# Patient Record
Sex: Male | Born: 1960 | Race: White | Hispanic: No | Marital: Married | Smoking: Never smoker
Health system: Southern US, Community
[De-identification: ages and names within clinical notes are randomized; demographics above are authoritative.]

## PROBLEM LIST (undated history)

## (undated) DIAGNOSIS — G4700 Insomnia, unspecified: Secondary | ICD-10-CM

## (undated) DIAGNOSIS — M5126 Other intervertebral disc displacement, lumbar region: Secondary | ICD-10-CM

## (undated) DIAGNOSIS — N3281 Overactive bladder: Secondary | ICD-10-CM

## (undated) DIAGNOSIS — M51369 Other intervertebral disc degeneration, lumbar region without mention of lumbar back pain or lower extremity pain: Secondary | ICD-10-CM

## (undated) DIAGNOSIS — G8929 Other chronic pain: Secondary | ICD-10-CM

## (undated) DIAGNOSIS — M549 Dorsalgia, unspecified: Secondary | ICD-10-CM

## (undated) DIAGNOSIS — N400 Enlarged prostate without lower urinary tract symptoms: Secondary | ICD-10-CM

## (undated) DIAGNOSIS — F419 Anxiety disorder, unspecified: Secondary | ICD-10-CM

## (undated) DIAGNOSIS — M5136 Other intervertebral disc degeneration, lumbar region: Secondary | ICD-10-CM

## (undated) DIAGNOSIS — N529 Male erectile dysfunction, unspecified: Secondary | ICD-10-CM

## (undated) DIAGNOSIS — M1990 Unspecified osteoarthritis, unspecified site: Secondary | ICD-10-CM

## (undated) HISTORY — DX: Male erectile dysfunction, unspecified: N52.9

## (undated) HISTORY — DX: Other intervertebral disc displacement, lumbar region: M51.26

## (undated) HISTORY — DX: Overactive bladder: N32.81

## (undated) HISTORY — PX: CARPAL TUNNEL RELEASE: SHX101

## (undated) HISTORY — DX: Insomnia, unspecified: G47.00

## (undated) HISTORY — DX: Benign prostatic hyperplasia without lower urinary tract symptoms: N40.0

## (undated) HISTORY — DX: Other intervertebral disc degeneration, lumbar region without mention of lumbar back pain or lower extremity pain: M51.369

## (undated) HISTORY — DX: Other intervertebral disc degeneration, lumbar region: M51.36

## (undated) HISTORY — PX: FRACTURE SURGERY: SHX138

---

## 2011-02-06 ENCOUNTER — Other Ambulatory Visit (HOSPITAL_COMMUNITY): Payer: Self-pay | Admitting: Neurosurgery

## 2011-02-06 DIAGNOSIS — M51369 Other intervertebral disc degeneration, lumbar region without mention of lumbar back pain or lower extremity pain: Secondary | ICD-10-CM

## 2011-02-06 DIAGNOSIS — M5136 Other intervertebral disc degeneration, lumbar region: Secondary | ICD-10-CM

## 2011-03-12 ENCOUNTER — Ambulatory Visit (HOSPITAL_COMMUNITY)
Admission: RE | Admit: 2011-03-12 | Discharge: 2011-03-12 | Disposition: A | Discharge status: Home / Self Care | Payer: 59 | Source: Ambulatory Visit | Attending: Neurosurgery | Admitting: Neurosurgery

## 2011-03-12 DIAGNOSIS — M51379 Other intervertebral disc degeneration, lumbosacral region without mention of lumbar back pain or lower extremity pain: Secondary | ICD-10-CM | POA: Insufficient documentation

## 2011-03-12 DIAGNOSIS — M5146 Schmorl's nodes, lumbar region: Secondary | ICD-10-CM | POA: Insufficient documentation

## 2011-03-12 DIAGNOSIS — M5136 Other intervertebral disc degeneration, lumbar region: Secondary | ICD-10-CM

## 2011-03-12 DIAGNOSIS — M5137 Other intervertebral disc degeneration, lumbosacral region: Secondary | ICD-10-CM | POA: Insufficient documentation

## 2011-03-12 DIAGNOSIS — M48061 Spinal stenosis, lumbar region without neurogenic claudication: Secondary | ICD-10-CM | POA: Insufficient documentation

## 2011-03-12 DIAGNOSIS — M549 Dorsalgia, unspecified: Secondary | ICD-10-CM | POA: Insufficient documentation

## 2011-03-12 DIAGNOSIS — M519 Unspecified thoracic, thoracolumbar and lumbosacral intervertebral disc disorder: Secondary | ICD-10-CM | POA: Insufficient documentation

## 2011-03-12 MED ORDER — IOHEXOL 180 MG/ML  SOLN
20.0000 mL | Freq: Once | INTRAMUSCULAR | Status: AC | PRN
  Administered 2011-03-12: 20 mL via INTRATHECAL

## 2015-02-21 ENCOUNTER — Encounter (HOSPITAL_COMMUNITY): Payer: Self-pay | Admitting: Neurology

## 2015-02-21 ENCOUNTER — Emergency Department (HOSPITAL_COMMUNITY)
Admission: EM | Admit: 2015-02-21 | Discharge: 2015-02-21 | Disposition: A | Discharge status: Home / Self Care | Payer: 59 | Attending: Emergency Medicine | Admitting: Emergency Medicine

## 2015-02-21 DIAGNOSIS — G8929 Other chronic pain: Secondary | ICD-10-CM | POA: Diagnosis not present

## 2015-02-21 DIAGNOSIS — M5410 Radiculopathy, site unspecified: Secondary | ICD-10-CM

## 2015-02-21 DIAGNOSIS — R202 Paresthesia of skin: Secondary | ICD-10-CM | POA: Diagnosis not present

## 2015-02-21 DIAGNOSIS — M549 Dorsalgia, unspecified: Secondary | ICD-10-CM | POA: Diagnosis not present

## 2015-02-21 DIAGNOSIS — M542 Cervicalgia: Secondary | ICD-10-CM | POA: Insufficient documentation

## 2015-02-21 HISTORY — DX: Dorsalgia, unspecified: M54.9

## 2015-02-21 HISTORY — DX: Other chronic pain: G89.29

## 2015-02-21 MED ORDER — HYDROCODONE-ACETAMINOPHEN 5-325 MG PO TABS: 1.0000 | ORAL_TABLET | Freq: Four times a day (QID) | ORAL | Status: DC | PRN

## 2015-02-21 MED ORDER — NAPROXEN 500 MG PO TABS
500.0000 mg | ORAL_TABLET | Freq: Two times a day (BID) | ORAL | Status: DC | PRN
Start: 2015-02-21 — End: 2015-12-14
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
## 2015-02-21 NOTE — ED Notes (Signed)
Pt reports for 2 months has had pain when turning neck and numbness to left upper back/arm. Has chronic back pain. MD is supposed to schedule MRI.

## 2015-02-21 NOTE — Discharge Instructions (Signed)
Take naprosyn as directed for inflammation and pain with norco for breakthrough pain. Do not drive or operate machinery with muscle relaxation use.  Use heat to the affected areas. Follow up with primary care physician for recheck of ongoing symptoms at your appointment tomorrow. Return to ER for emergent changing or worsening of symptoms.     Cervical Radiculopathy Cervical radiculopathy means a nerve in the neck is pinched or bruised. This can cause pain or loss of feeling (numbness) that runs from your neck to your arm and fingers. HOME CARE   Put ice on the injured or painful area.  Put ice in a plastic bag.  Place a towel between your skin and the bag.  Leave the ice on for 15-20 minutes, 03-04 times a day, or as told by your doctor.  If ice does not help, you can try using heat. Take a warm shower or bath, or use a hot water bottle as told by your doctor.  You may try a gentle neck and shoulder massage.  Use a flat pillow when you sleep.  Only take medicines as told by your doctor.  Keep all physical therapy visits as told by your doctor.  If you are given a soft collar, wear it as told by your doctor. GET HELP RIGHT AWAY IF:   Your pain gets worse and is not controlled with medicine.  You lose feeling or feel weak in your hand, arm, face, or leg.  You have a fever or stiff neck.  You cannot control when you poop or pee (incontinence).  You have trouble with walking, balance, or speaking. MAKE SURE YOU:   Understand these instructions.  Will watch your condition.  Will get help right away if you are not doing well or get worse. Document Released: 10/10/2011 Document Revised: 01/13/2012 Document Reviewed: 10/10/2011 [facility] Patient Information 2015 [place]. This information is not intended to replace advice given to you by your health care provider. Make sure you discuss any questions you have with your health care provider.  Musculoskeletal  Pain Musculoskeletal pain is muscle and boney aches and pains. These pains can occur in any part of the body. Your caregiver may treat you without knowing the cause of the pain. They may treat you if blood or urine tests, X-rays, and other tests were normal.  CAUSES There is often not a definite cause or reason for these pains. These pains may be caused by a type of germ (virus). The discomfort may also come from overuse. Overuse includes working out too hard when your body is not fit. Boney aches also come from weather changes. Bone is sensitive to atmospheric pressure changes. HOME CARE INSTRUCTIONS   Ask when your test results will be ready. Make sure you get your test results.  Only take over-the-counter or prescription medicines for pain, discomfort, or fever as directed by your caregiver. If you were given medications for your condition, do not drive, operate machinery or power tools, or sign legal documents for 24 hours. Do not drink alcohol. Do not take sleeping pills or other medications that may interfere with treatment.  Continue all activities unless the activities cause more pain. When the pain lessens, slowly resume normal activities. Gradually increase the intensity and duration of the activities or exercise.  During periods of severe pain, bed rest may be helpful. Lay or sit in any position that is comfortable.  Putting ice on the injured area.  Put ice in a bag.  Place a  towel between your skin and the bag.  Leave the ice on for 15 to 20 minutes, 3 to 4 times a day.  Follow up with your caregiver for continued problems and no reason can be found for the pain. If the pain becomes worse or does not go away, it may be necessary to repeat tests or do additional testing. Your caregiver may need to look further for a possible cause. SEEK IMMEDIATE MEDICAL CARE IF:  You have pain that is getting worse and is not relieved by medications.  You develop chest pain that is associated  with shortness or breath, sweating, feeling sick to your stomach (nauseous), or throw up (vomit).  Your pain becomes localized to the abdomen.  You develop any new symptoms that seem different or that concern you. MAKE SURE YOU:   Understand these instructions.  Will watch your condition.  Will get help right away if you are not doing well or get worse. Document Released: 10/21/2005 Document Revised: 01/13/2012 Document Reviewed: 06/25/2013 [facility] Patient Information 2015 [place], [place]. This information is not intended to replace advice given to you by your health care provider. Make sure you discuss any questions you have with your health care provider.  Paresthesia Paresthesia is an abnormal burning or prickling sensation. This sensation is generally felt in the hands, arms, legs, or feet. However, it may occur in any part of the body. It is usually not painful. The feeling may be described as:  Tingling or numbness.  "Pins and needles."  Skin crawling.  Buzzing.  Limbs "falling asleep."  Itching. Most people experience temporary (transient) paresthesia at some time in their lives. CAUSES  Paresthesia may occur when you breathe too quickly (hyperventilation). It can also occur without any apparent cause. Commonly, paresthesia occurs when pressure is placed on a nerve. The feeling quickly goes away once the pressure is removed. For some people, however, paresthesia is a long-lasting (chronic) condition caused by an underlying disorder. The underlying disorder may be:  A traumatic, direct injury to nerves. Examples include a:  Broken (fractured) neck.  Fractured skull.  A disorder affecting the brain and spinal cord (central nervous system). Examples include:  Transverse myelitis.  Encephalitis.  Transient ischemic attack.  Multiple sclerosis.  Stroke.  Tumor or blood vessel problems, such as an arteriovenous malformation pressing against the brain or spinal  cord.  A condition that damages the peripheral nerves (peripheral neuropathy). Peripheral nerves are not part of the brain and spinal cord. These conditions include:  Diabetes.  Peripheral vascular disease.  Nerve entrapment syndromes, such as carpal tunnel syndrome.  Shingles.  Hypothyroidism.  Vitamin B12 deficiencies.  Alcoholism.  Heavy metal poisoning (lead, arsenic).  Rheumatoid arthritis.  Systemic lupus erythematosus. DIAGNOSIS  Your caregiver will attempt to find the underlying cause of your paresthesia. Your caregiver may:  Take your medical history.  Perform a physical exam.  Order various lab tests.  Order imaging tests. TREATMENT  Treatment for paresthesia depends on the underlying cause. HOME CARE INSTRUCTIONS  Avoid drinking alcohol.  You may consider massage or acupuncture to help relieve your symptoms.  Keep all follow-up appointments as directed by your caregiver. SEEK IMMEDIATE MEDICAL CARE IF:   You feel weak.  You have trouble walking or moving.  You have problems with speech or vision.  You feel confused.  You cannot control your bladder or bowel movements.  You feel numbness after an injury.  You faint.  Your burning or prickling feeling gets worse when  walking.  You have pain, cramps, or dizziness.  You develop a rash. MAKE SURE YOU:  Understand these instructions.  Will watch your condition.  Will get help right away if you are not doing well or get worse. Document Released: 10/11/2002 Document Revised: 01/13/2012 Document Reviewed: 07/12/2011 [facility] Patient Information 2015 [place], [place]. This information is not intended to replace advice given to you by your health care provider. Make sure you discuss any questions you have with your health care provider.  Neuropathic Pain We often think that pain has a physical cause. If we get rid of the cause, the pain should go away. Nerves themselves can also cause pain. It  is called neuropathic pain, which means nerve abnormality. It may be difficult for the patients who have it and for the treating caregivers. Pain is usually described as acute (short-lived) or chronic (long-lasting). Acute pain is related to the physical sensations caused by an injury. It can last from a few seconds to many weeks, but it usually goes away when normal healing occurs. Chronic pain lasts beyond the typical healing time. With neuropathic pain, the nerve fibers themselves may be damaged or injured. They then send incorrect signals to other pain centers. The pain you feel is real, but the cause is not easy to find.  CAUSES  Chronic pain can result from diseases, such as diabetes and shingles (an infection related to chickenpox), or from trauma, surgery, or amputation. It can also happen without any known injury or disease. The nerves are sending pain messages, even though there is no identifiable cause for such messages.   Other common causes of neuropathy include diabetes, phantom limb pain, or Regional Pain Syndrome (RPS).  As with all forms of chronic back pain, if neuropathy is not correctly treated, there can be a number of associated problems that lead to a downward cycle for the patient. These include depression, sleeplessness, feelings of fear and anxiety, limited social interaction and inability to do normal daily activities or work.  The most dramatic and mysterious example of neuropathic pain is called "phantom limb syndrome." This occurs when an arm or a leg has been removed because of illness or injury. The brain still gets pain messages from the nerves that originally carried impulses from the missing limb. These nerves now seem to misfire and cause troubling pain.  Neuropathic pain often seems to have no cause. It responds poorly to standard pain treatment. Neuropathic pain can occur after:  Shingles (herpes zoster virus infection).  A lasting burning sensation of the skin,  caused usually by injury to a peripheral nerve.  Peripheral neuropathy which is widespread nerve damage, often caused by diabetes or alcoholism.  Phantom limb pain following an amputation.  Facial nerve problems (trigeminal neuralgia).  Multiple sclerosis.  Reflex sympathetic dystrophy.  Pain which comes with cancer and cancer chemotherapy.  Entrapment neuropathy such as when pressure is put on a nerve such as in carpal tunnel syndrome.  Back, leg, and hip problems (sciatica).  Spine or back surgery.  HIV Infection or AIDS where nerves are infected by viruses. Your caregiver can explain items in the above list which may apply to you. SYMPTOMS  Characteristics of neuropathic pain are:  Severe, sharp, electric shock-like, shooting, lightening-like, knife-like.  Pins and needles sensation.  Deep burning, deep cold, or deep ache.  Persistent numbness, tingling, or weakness.  Pain resulting from light touch or other stimulus that would not usually cause pain.  Increased sensitivity to something that would  normally cause pain, such as a pinprick. Pain may persist for months or years following the healing of damaged tissues. When this happens, pain signals no longer sound an alarm about current injuries or injuries about to happen. Instead, the alarm system itself is not working correctly.  Neuropathic pain may get worse instead of better over time. For some people, it can lead to serious disability. It is important to be aware that severe injury in a limb can occur without a proper, protective pain response.Burns, cuts, and other injuries may go unnoticed. Without proper treatment, these injuries can become infected or lead to further disability. Take any injury seriously, and consult your caregiver for treatment. DIAGNOSIS  When you have a pain with no known cause, your caregiver will probably ask some specific questions:   Do you have any other conditions, such as diabetes,  shingles, multiple sclerosis, or HIV infection?  How would you describe your pain? (Neuropathic pain is often described as shooting, stabbing, burning, or searing.)  Is your pain worse at any time of the day? (Neuropathic pain is usually worse at night.)  Does the pain seem to follow a certain physical pathway?  Does the pain come from an area that has missing or injured nerves? (An example would be phantom limb pain.)  Is the pain triggered by minor things such as rubbing against the sheets at night? These questions often help define the type of pain involved. Once your caregiver knows what is happening, treatment can begin. Anticonvulsant, antidepressant drugs, and various pain relievers seem to work in some cases. If another condition, such as diabetes is involved, better management of that disorder may relieve the neuropathic pain.  TREATMENT  Neuropathic pain is frequently long-lasting and tends not to respond to treatment with narcotic type pain medication. It may respond well to other drugs such as antiseizure and antidepressant medications. Usually, neuropathic problems do not completely go away, but partial improvement is often possible with proper treatment. Your caregivers have large numbers of medications available to treat you. Do not be discouraged if you do not get immediate relief. Sometimes different medications or a combination of medications will be tried before you receive the results you are hoping for. See your caregiver if you have pain that seems to be coming from nowhere and does not go away. Help is available.  SEEK IMMEDIATE MEDICAL CARE IF:   There is a sudden change in the quality of your pain, especially if the change is on only one side of the body.  You notice changes of the skin, such as redness, black or purple discoloration, swelling, or an ulcer.  You cannot move the affected limbs. Document Released: 07/18/2004 Document Revised: 01/13/2012 Document Reviewed:  07/18/2004 [facility] Patient Information 2015 [place], [place]. This information is not intended to replace advice given to you by your health care provider. Make sure you discuss any questions you have with your health care provider.

## 2015-11-12 NOTE — Progress Notes (Signed)
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  culture grew streptococcus, was on rocephin 2 g IV once daily since 11/05/2015. CRP was elevated initially, ESR elevated.  Foley catheter placed for BPH.   initially Orthopedics team consulted, pt has been on medical team.   he was in ICU  Only for IV valium for back spasm.  He has been afebrile, normotensive, alert and oriented.   since yesterday he reports worsening left back pain, left leg pain and  left leg weakness, underwent MRI of the back today  Showed possible extension of the discitis with abscess formation, . Orthopedics team wanted the patient to be transferred to [facility] for  ID input, and orthopedics will be consulted by their medical team to follow the patient here. Please call ID for recommendations when the patient gets here.   Hosie Poisson, MD [phone]

## 2015-11-13 ENCOUNTER — Other Ambulatory Visit: Payer: Self-pay

## 2015-11-13 ENCOUNTER — Encounter (HOSPITAL_COMMUNITY): Payer: Self-pay | Admitting: *Deleted

## 2015-11-13 ENCOUNTER — Inpatient Hospital Stay (HOSPITAL_COMMUNITY): Payer: 59

## 2015-11-13 ENCOUNTER — Inpatient Hospital Stay (HOSPITAL_COMMUNITY)
Admission: AD | Admit: 2015-11-13 | Discharge: 2015-11-17 | DRG: 551 | Disposition: A | Discharge status: Home Health | Payer: 59 | Source: Other Acute Inpatient Hospital | Attending: Internal Medicine | Admitting: Internal Medicine

## 2015-11-13 DIAGNOSIS — M519 Unspecified thoracic, thoracolumbar and lumbosacral intervertebral disc disorder: Secondary | ICD-10-CM | POA: Diagnosis not present

## 2015-11-13 DIAGNOSIS — L8990 Pressure ulcer of unspecified site, unspecified stage: Secondary | ICD-10-CM | POA: Insufficient documentation

## 2015-11-13 DIAGNOSIS — M549 Dorsalgia, unspecified: Secondary | ICD-10-CM

## 2015-11-13 DIAGNOSIS — N400 Enlarged prostate without lower urinary tract symptoms: Secondary | ICD-10-CM | POA: Diagnosis not present

## 2015-11-13 DIAGNOSIS — M4806 Spinal stenosis, lumbar region: Secondary | ICD-10-CM | POA: Diagnosis present

## 2015-11-13 DIAGNOSIS — B954 Other streptococcus as the cause of diseases classified elsewhere: Secondary | ICD-10-CM | POA: Diagnosis not present

## 2015-11-13 DIAGNOSIS — M21372 Foot drop, left foot: Secondary | ICD-10-CM | POA: Diagnosis present

## 2015-11-13 DIAGNOSIS — M4626 Osteomyelitis of vertebra, lumbar region: Secondary | ICD-10-CM | POA: Diagnosis present

## 2015-11-13 DIAGNOSIS — N401 Enlarged prostate with lower urinary tract symptoms: Secondary | ICD-10-CM | POA: Diagnosis present

## 2015-11-13 DIAGNOSIS — G061 Intraspinal abscess and granuloma: Secondary | ICD-10-CM | POA: Diagnosis present

## 2015-11-13 DIAGNOSIS — M4646 Discitis, unspecified, lumbar region: Secondary | ICD-10-CM | POA: Diagnosis present

## 2015-11-13 DIAGNOSIS — L89152 Pressure ulcer of sacral region, stage 2: Secondary | ICD-10-CM | POA: Diagnosis present

## 2015-11-13 DIAGNOSIS — R338 Other retention of urine: Secondary | ICD-10-CM | POA: Diagnosis present

## 2015-11-13 DIAGNOSIS — M8610 Other acute osteomyelitis, unspecified site: Secondary | ICD-10-CM | POA: Diagnosis present

## 2015-11-13 DIAGNOSIS — R339 Retention of urine, unspecified: Secondary | ICD-10-CM

## 2015-11-13 DIAGNOSIS — G8929 Other chronic pain: Secondary | ICD-10-CM | POA: Diagnosis present

## 2015-11-13 DIAGNOSIS — Z452 Encounter for adjustment and management of vascular access device: Secondary | ICD-10-CM

## 2015-11-13 HISTORY — DX: Anxiety disorder, unspecified: F41.9

## 2015-11-13 HISTORY — DX: Unspecified osteoarthritis, unspecified site: M19.90

## 2015-11-13 LAB — CBC WITH DIFFERENTIAL/PLATELET
Basophils Absolute: 0 10*3/uL (ref 0.0–0.1)
Basophils Relative: 1 %
EOS ABS: 0.2 10*3/uL (ref 0.0–0.7)
EOS PCT: 3 %
HCT: 42.6 % (ref 39.0–52.0)
HEMOGLOBIN: 14.4 g/dL (ref 13.0–17.0)
LYMPHS ABS: 1.2 10*3/uL (ref 0.7–4.0)
Lymphocytes Relative: 18 %
MCH: 29.8 pg (ref 26.0–34.0)
MCHC: 33.8 g/dL (ref 30.0–36.0)
MCV: 88.2 fL (ref 78.0–100.0)
MONO ABS: 0.9 10*3/uL (ref 0.1–1.0)
MONOS PCT: 13 %
Neutro Abs: 4.6 10*3/uL (ref 1.7–7.7)
Neutrophils Relative %: 65 %
PLATELETS: 369 10*3/uL (ref 150–400)
RBC: 4.83 MIL/uL (ref 4.22–5.81)
RDW: 12.3 % (ref 11.5–15.5)
WBC: 6.9 10*3/uL (ref 4.0–10.5)

## 2015-11-13 LAB — COMPREHENSIVE METABOLIC PANEL
ALBUMIN: 2.9 g/dL — AB (ref 3.5–5.0)
ALT: 24 U/L (ref 17–63)
AST: 22 U/L (ref 15–41)
Alkaline Phosphatase: 78 U/L (ref 38–126)
Anion gap: 11 (ref 5–15)
BUN: 21 mg/dL — AB (ref 6–20)
CHLORIDE: 94 mmol/L — AB (ref 101–111)
CO2: 29 mmol/L (ref 22–32)
Calcium: 9.5 mg/dL (ref 8.9–10.3)
Creatinine, Ser: 1.1 mg/dL (ref 0.61–1.24)
GFR calc Af Amer: >60 mL/min (ref >60)
GFR calc non Af Amer: >60 mL/min (ref >60)
GLUCOSE: 103 mg/dL — AB (ref 65–99)
POTASSIUM: 4.3 mmol/L (ref 3.5–5.1)
Sodium: 134 mmol/L — ABNORMAL LOW (ref 135–145)
Total Bilirubin: 0.7 mg/dL (ref 0.3–1.2)
Total Protein: 6.8 g/dL (ref 6.5–8.1)

## 2015-11-13 LAB — APTT: aPTT: 34 seconds (ref 24–37)

## 2015-11-13 LAB — PROTIME-INR
INR: 1.13 (ref 0.00–1.49)
PROTHROMBIN TIME: 14.7 s (ref 11.6–15.2)

## 2015-11-13 MED ORDER — KETOROLAC TROMETHAMINE 30 MG/ML IJ SOLN
30.0000 mg | Freq: Three times a day (TID) | INTRAMUSCULAR | Status: DC | PRN
Start: 2015-11-13 — End: 2015-11-17
  Administered 2015-11-13: 30 mg via INTRAVENOUS
  Filled 2015-11-13: qty 1

## 2015-11-13 MED ORDER — TAMSULOSIN HCL 0.4 MG PO CAPS
0.8000 mg | ORAL_CAPSULE | Freq: Every day | ORAL | Status: DC
  Administered 2015-11-13 – 2015-11-17 (×5): 0.8 mg via ORAL
  Filled 2015-11-13 (×5): qty 2

## 2015-11-13 MED ORDER — ENOXAPARIN SODIUM 40 MG/0.4ML ~~LOC~~ SOLN
40.0000 mg | SUBCUTANEOUS | Status: DC
  Administered 2015-11-13 – 2015-11-17 (×5): 40 mg via SUBCUTANEOUS
  Filled 2015-11-13 (×5): qty 0.4

## 2015-11-13 MED ORDER — ACETAMINOPHEN 650 MG RE SUPP: 650.0000 mg | Freq: Four times a day (QID) | RECTAL | Status: DC | PRN

## 2015-11-13 MED ORDER — OXYCODONE HCL 5 MG PO TABS
5.0000 mg | ORAL_TABLET | ORAL | Status: DC | PRN
  Administered 2015-11-13 – 2015-11-14 (×2): 5 mg via ORAL
  Filled 2015-11-13 (×2): qty 1

## 2015-11-13 MED ORDER — SODIUM CHLORIDE 0.9 % IJ SOLN
10.0000 mL | INTRAMUSCULAR | Status: DC | PRN
  Administered 2015-11-13 – 2015-11-15 (×5): 10 mL
  Filled 2015-11-13 (×5): qty 40

## 2015-11-13 MED ORDER — DEXTROSE 5 % IV SOLN: 1000.0000 mg | Freq: Three times a day (TID) | INTRAVENOUS | Status: DC | PRN

## 2015-11-13 MED ORDER — ONDANSETRON HCL 4 MG/2ML IJ SOLN: 4.0000 mg | Freq: Four times a day (QID) | INTRAMUSCULAR | Status: DC | PRN

## 2015-11-13 MED ORDER — SODIUM CHLORIDE 0.9 % IV SOLN
250.0000 mL | INTRAVENOUS | Status: DC | PRN
  Administered 2015-11-15: 250 mL via INTRAVENOUS

## 2015-11-13 MED ORDER — MORPHINE SULFATE (PF) 4 MG/ML IV SOLN
4.0000 mg | INTRAVENOUS | Status: DC | PRN
  Administered 2015-11-13 – 2015-11-16 (×9): 4 mg via INTRAVENOUS
  Filled 2015-11-13 (×10): qty 1

## 2015-11-13 MED ORDER — SODIUM CHLORIDE 0.9 % IJ SOLN: 3.0000 mL | INTRAMUSCULAR | Status: DC | PRN

## 2015-11-13 MED ORDER — ONDANSETRON HCL 4 MG PO TABS: 4.0000 mg | ORAL_TABLET | Freq: Four times a day (QID) | ORAL | Status: DC | PRN

## 2015-11-13 MED ORDER — DEXTROSE 5 % IV SOLN
2.0000 g | INTRAVENOUS | Status: DC
  Administered 2015-11-14 – 2015-11-17 (×4): 2 g via INTRAVENOUS
  Filled 2015-11-13 (×7): qty 2

## 2015-11-13 MED ORDER — SODIUM CHLORIDE 0.9 % IJ SOLN
3.0000 mL | Freq: Two times a day (BID) | INTRAMUSCULAR | Status: DC
  Administered 2015-11-13 – 2015-11-17 (×3): 3 mL via INTRAVENOUS

## 2015-11-13 MED ORDER — ZOLPIDEM TARTRATE 5 MG PO TABS
10.0000 mg | ORAL_TABLET | Freq: Every evening | ORAL | Status: DC | PRN
  Administered 2015-11-14 – 2015-11-16 (×4): 10 mg via ORAL
  Filled 2015-11-13 (×4): qty 2

## 2015-11-13 MED ORDER — ACETAMINOPHEN 325 MG PO TABS: 650.0000 mg | ORAL_TABLET | Freq: Four times a day (QID) | ORAL | Status: DC | PRN

## 2015-11-13 NOTE — Progress Notes (Signed)
Arrival Method: via stretcher Mental Status: alert and oriented x4 Telemetry: applied and CCMD notified Skin: stage 2 to sacrum, foam applied Tubes: n/a IV: RUA PICC double lumen Pain: 10/10 back pain (medication given) Family: none present  Safety Measures: bed in lowest position, call bell in reach 6E Orientation: oriented to staff and unit
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
## 2015-11-13 NOTE — Consult Note (Signed)
[facility]    Date of Admission:  11/13/2015   Total days of antibiotics 12               Reason for Consult: Lumbar discitis with epidural abscess    Referring Physician: Dr. Shelly Flattenavid Merrell  Principal Problem:   Lumbar discitis Active Problems:   Abscess in epidural space of lumbar spine   Left foot drop   BPH (benign prostatic hyperplasia)   Urinary retention   Chronic back pain   . cefTRIAXone (ROCEPHIN)  IV  2 g Intravenous Q24H  . enoxaparin (LOVENOX) injection  40 mg Subcutaneous Q24H  . sodium chloride  3 mL Intravenous Q12H  . tamsulosin  0.8 mg Oral Daily    Recommendations: 1. Continue ceftriaxone alone for now   Assessment: Evan Wolf as lumbar discitis with a small epidural abscess caused by Streptococcus mitis, a member of the viridans streptococcal family. His isolate was tested against penicillin and cephalosporins and found to be quite sensitive. I see no indication to broaden his antibiotic therapy at this time. I agree with continued medical management. Vertebral infection is notoriously slow to resolve. I do not expect improvement only 12 days into antibiotic therapy. I will follow with you.    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 He was eventually admitted to [facility] where MRI showed L2-3 discitis. He underwent disc aspiration on 11/02/2015 and cultures grew Streptococcus mitis sensitive to all antibiotics tested. He was initially on vancomycin and ceftriaxone and after culture results returned vancomycin was stopped. He was able to get up and start walking with the physical therapist. He states that he has lost about 25 pounds unintentionally since his illness began and has been extremely weak. He feels like he's had difficulty raising his left foot over the past 4-5 days. Repeat MRI was obtained 2 days ago which showed some slight progression. He was  transferred here today for neurosurgical and infectious disease evaluation.   Review of Systems: Review of Systems  Constitutional: Positive for chills, weight loss, malaise/fatigue and diaphoresis. Negative for fever.  HENT: Negative for sore throat.   Respiratory: Negative for cough and sputum production.   Cardiovascular: Negative for chest pain.  Gastrointestinal: Negative for nausea, vomiting and diarrhea.  Genitourinary: Negative for dysuria.  Musculoskeletal: Positive for back pain.  Skin: Negative for rash.  Neurological: Positive for tingling, focal weakness and weakness. Negative for headaches.    Past Medical History  Diagnosis Date  . Chronic back pain   . Anxiety   . Arthritis     Social History  Substance Use Topics  . Smoking status: Never Smoker   . Smokeless tobacco: None  . Alcohol Use: No    Family History  Problem Relation Age of Onset  . Alzheimer's disease Father   . Parkinson's disease Father   . Diabetes Mellitus II Mother   . Deep vein thrombosis Mother    No Known Allergies  OBJECTIVE: Blood pressure 100/64, pulse 86, temperature 98.4 F (36.9 C), temperature source Oral, resp. rate 18, height 6' (1.829 m), weight 154 lb 1.6 oz (69.9 kg), SpO2 92 %.  Physical Exam  Constitutional: He is oriented to person, place, and time. No distress.  HENT:  Mouth/Throat: No oropharyngeal exudate.  Eyes: Conjunctivae are normal.  Neck: Neck supple.  Cardiovascular: Normal rate  and regular rhythm.   No murmur heard. Pulmonary/Chest: Breath sounds normal.  Abdominal: Soft. He exhibits no mass. There is no tenderness.  Musculoskeletal: Normal range of motion.  Neurological: He is alert and oriented to person, place, and time.  Perhaps a very mild weakness on dorsiflexion of his left foot otherwise no focal abnormalities noted.  Skin: No rash noted.  Psychiatric: Mood and affect normal.    Lab Results Lab Results  Component Value Date   WBC 6.9  11/13/2015   HGB 14.4 11/13/2015   HCT 42.6 11/13/2015   MCV 88.2 11/13/2015   PLT 369 11/13/2015    Lab Results  Component Value Date   CREATININE 1.10 11/13/2015   BUN 21* 11/13/2015   NA 134* 11/13/2015   K 4.3 11/13/2015   CL 94* 11/13/2015   CO2 29 11/13/2015    Lab Results  Component Value Date   ALT 24 11/13/2015   AST 22 11/13/2015   ALKPHOS 78 11/13/2015   BILITOT 0.7 11/13/2015     Microbiology: No results found for this or any previous visit (from the past 240 hour(s)).   Cliffton Asters, MD [facility] for Infectious Disease [facility] [phone] pager   [phone] cell 11/13/2015, 5:23 PM

## 2015-11-13 NOTE — Consult Note (Signed)
Reason for Consult: discitis osteomyelitis epidural abscess Referring Physician:  [name]  Evan Wolf is an 55 y.o. male.  HPI:  55 year old gentleman is admitted back pain ever since December 25 23 Evan Wolf was evaluated and was transferred out ultimately sized primary medical doctor  Regarding looks much 68 back to the ER patient was admitted to Evan Wolf was worked up was diagnosed with a L1-L2 discitis CT aspiration of the disc space grew out a streptococcal organism and he was admitted and placed on Rocephin for he apparently wasn't responding to the IV antibiotics and repeated the MRI showed a worsening of the osteomyelitis and patient was been transferred to Evan Wolf for further workup and management. Currently the patient says   That his his legs have not had as much pain  In them and he hasn't noticed any new numbness or tingling. The pain is all across his low back and hasn't changed.  Past Medical History  Diagnosis Date  . Chronic back pain   . Anxiety   . Arthritis     Past Surgical History  Procedure Laterality Date  . Carpal tunnel release Right   . Fracture surgery Right     ankle    Family History  Problem Relation Age of Onset  . Alzheimer's disease Father   . Parkinson's disease Father   . Diabetes Mellitus II Mother   . Deep vein thrombosis Mother     Social History:  reports that he has never smoked. He does not have any smokeless tobacco history on file. He reports that he does not drink alcohol or use illicit drugs.  Allergies: No Known Allergies  Medications: I have reviewed the patient's current medications.  No results found for this or any previous visit (from the past 48 hour(s)).  Dg Chest Port 1 View  11/13/2015  CLINICAL DATA:  PICC line insertion. EXAM: PORTABLE CHEST 1 VIEW COMPARISON:  11/10/2015. FINDINGS: PICC line noted with tip projected superior vena cava. Mediastinum hilar structures are unremarkable. Cardiomegaly with normal pulmonary  vascularity. Low lung volumes with mild right base subsegmental atelectasis. No pleural effusion or pneumothorax . IMPRESSION: 1. Right PICC line noted with tip projected over the superior vena cava. 2. Low lung volumes with bibasilar subsegmental atelectasis. Electronically Signed   By: Maisie Fushomas  Register   On: 11/13/2015 13:26    Review of Systems  Constitutional: Negative.   HENT: Negative.   Eyes: Negative.   Respiratory: Negative.   Cardiovascular: Negative.   Gastrointestinal: Negative.   Genitourinary: Negative.   Musculoskeletal: Positive for myalgias, back pain and joint pain.  Skin: Negative.   Neurological: Positive for tingling.  Psychiatric/Behavioral: Negative.    Height 6' (1.829 m), weight 69.9 kg (154 lb 1.6 oz). Physical Exam  Constitutional: He is oriented to person, place, and time. He appears well-developed and well-nourished.  HENT:  Head: Normocephalic.  Eyes: Pupils are equal, round, and reactive to light.  Neck: Normal range of motion.  Respiratory: Effort normal.  GI: Soft.  Musculoskeletal: Normal range of motion.  Neurological: He is alert and oriented to person, place, and time. He has normal strength. GCS eye subscore is 4. GCS verbal subscore is 5. GCS motor subscore is 6.   Patient is awake alert tenderness in his low back but strength is 5 out of 5 in his iliopsoas, quads, hams she's, gastrocsoleus tibialis, EHL. He does have some pain limited weakness. I do not the key has any focal muscle weakness.  Skin: Skin  is warm and dry.    Assessment/Plan:  55 year old gentleman L1-L2 discitis osteomyelitis and a very small ventral epidural component that is minimally if at all changed. There is a little more fluid cystic component in it however is causing little to no mass effect maybe some mild foraminal stenosis of the left L3 nerve root. In the setting of improved radiculopathy I would recommend continued interlock treatment and nonsurgical. I recommended  broadening his coverage apparently already has infectious disease consulted. Physical occupational therapy and reconsult if he develops worsening neurologic signs.  Evan Wolf 11/13/2015, 4:04 PM

## 2015-11-14 DIAGNOSIS — L8990 Pressure ulcer of unspecified site, unspecified stage: Secondary | ICD-10-CM | POA: Insufficient documentation

## 2015-11-14 DIAGNOSIS — M8610 Other acute osteomyelitis, unspecified site: Secondary | ICD-10-CM | POA: Diagnosis present

## 2015-11-14 LAB — CBC
HCT: 42.5 % (ref 39.0–52.0)
Hemoglobin: 14.4 g/dL (ref 13.0–17.0)
MCH: 29.6 pg (ref 26.0–34.0)
MCHC: 33.9 g/dL (ref 30.0–36.0)
MCV: 87.4 fL (ref 78.0–100.0)
PLATELETS: 382 10*3/uL (ref 150–400)
RBC: 4.86 MIL/uL (ref 4.22–5.81)
RDW: 12.2 % (ref 11.5–15.5)
WBC: 8 10*3/uL (ref 4.0–10.5)

## 2015-11-14 LAB — BASIC METABOLIC PANEL
ANION GAP: 9 (ref 5–15)
BUN: 26 mg/dL — ABNORMAL HIGH (ref 6–20)
CALCIUM: 9.5 mg/dL (ref 8.9–10.3)
CO2: 31 mmol/L (ref 22–32)
Chloride: 97 mmol/L — ABNORMAL LOW (ref 101–111)
Creatinine, Ser: 1.16 mg/dL (ref 0.61–1.24)
GLUCOSE: 115 mg/dL — AB (ref 65–99)
Potassium: 4.4 mmol/L (ref 3.5–5.1)
SODIUM: 137 mmol/L (ref 135–145)

## 2015-11-14 LAB — HIV ANTIBODY (ROUTINE TESTING W REFLEX): HIV Screen 4th Generation wRfx: NONREACTIVE

## 2015-11-14 LAB — RPR: RPR: NONREACTIVE

## 2015-11-14 LAB — SEDIMENTATION RATE: SED RATE: 47 mm/h — AB (ref 0–16)

## 2015-11-14 LAB — C-REACTIVE PROTEIN: CRP: 3.3 mg/dL — ABNORMAL HIGH (ref <1.0)

## 2015-11-14 MED ORDER — SENNA 8.6 MG PO TABS
2.0000 | ORAL_TABLET | Freq: Every day | ORAL | Status: DC
  Administered 2015-11-14 – 2015-11-17 (×4): 17.2 mg via ORAL
  Filled 2015-11-14 (×4): qty 2

## 2015-11-14 MED ORDER — PRO-STAT SUGAR FREE PO LIQD
30.0000 mL | Freq: Two times a day (BID) | ORAL | Status: DC
  Administered 2015-11-14 – 2015-11-17 (×5): 30 mL via ORAL
  Filled 2015-11-14 (×5): qty 30

## 2015-11-14 MED ORDER — COLLAGENASE 250 UNIT/GM EX OINT
TOPICAL_OINTMENT | Freq: Every day | CUTANEOUS | Status: DC
  Administered 2015-11-14 – 2015-11-16 (×3): via TOPICAL
  Administered 2015-11-17: 1 via TOPICAL
  Filled 2015-11-14: qty 30

## 2015-11-14 MED ORDER — DOCUSATE SODIUM 100 MG PO CAPS
100.0000 mg | ORAL_CAPSULE | Freq: Two times a day (BID) | ORAL | Status: DC
  Administered 2015-11-14 – 2015-11-17 (×7): 100 mg via ORAL
  Filled 2015-11-14 (×7): qty 1

## 2015-11-14 MED ORDER — BOOST / RESOURCE BREEZE PO LIQD
1.0000 | Freq: Three times a day (TID) | ORAL | Status: DC
  Administered 2015-11-14 – 2015-11-17 (×8): 1 via ORAL

## 2015-11-14 NOTE — Evaluation (Signed)
Occupational Therapy Evaluation Patient Details Name: Evan Wolf MRN: [id number] DOB: 10/15/1961 Today's Date: 11/14/2015    History of Present Illness 55 year old male with a history of chronic back pain and BPH presented to [facility] for week history of acute on chronic low back pain with associated left leg weakness.. The patient was admitted to [facility] on 10/31/2015 and workup revealed acute osteomyelitis and discitis L1-L2. Patient underwent MRI of the lumbar spine 11/13/2015. There was concern of worsening discitis with abscess formation. As a result, the patient was transferred to [facility].   Clinical Impression   PT admitted with acute osteomyelitis. Pt currently with functional limitiations due to the deficits listed below (see OT problem list). Pt was unable to tolerate OT session and lives alone. Recommending higher level of care due to severe pain. Pt will benefit from skilled OT to increase their independence and safety with adls and balance to allow discharge SNF.     Follow Up Recommendations  SNF    Equipment Recommendations  3 in 1 bedside comode;Other (comment) (rw)    Recommendations for Other Services       Precautions / Restrictions Precautions Precautions: Fall Precaution Comments: educated on back precautions with bed mobility Restrictions Other Position/Activity Restrictions: pt self WBAT on bilat LEs due to increased pain      Mobility Bed Mobility Overal bed mobility: Needs Assistance;+2 for physical assistance Bed Mobility: Rolling;Sit to Supine Rolling: Min assist Sidelying to sit: Mod assist   Sit to supine: Max assist   General bed mobility comments: needs max cues and (A) to maintain back precautions  Transfers Overall transfer level: Needs assistance Equipment used: Rolling walker (2 wheeled) Transfers: Sit to/from Stand Sit to Stand: +2 physical assistance;Mod assist         General transfer comment: pt power up and then  using only BIL UE in RW to remain upright. Pt assisted with powering up into standing and maintain standing. pt stand pivot to the L to the bed. Pt side stepping toward HOB with max cues to remain standing. pt attempting to sit prematurely.    Balance Overall balance assessment: Needs assistance Sitting-balance support: Bilateral upper extremity supported;Feet supported Sitting balance-Leahy Scale: Poor Sitting balance - Comments: posterior lean   Standing balance support: Bilateral upper extremity supported;During functional activity Standing balance-Leahy Scale: Poor Standing balance comment: pt requires bilat UE assist and RW for safe standign                            ADL Overall ADL's : Needs assistance/impaired Eating/Feeding: Modified independent;Sitting Eating/Feeding Details (indicate cue type and reason): pt reports I only eat fruit water orange juice sprite i have no desire for anything else. Pt educated that all meals are in the chair and pt repeated information back to therapist to demonstrate understanding Grooming: Wash/dry face;Modified independent;Bed level   Upper Body Bathing: Moderate assistance               Toilet Transfer: Minimal assistance;+2 for physical assistance;Stand-pivot;RW (simulated from chair to bed) Toilet Transfer Details (indicate cue type and reason): pt needed cues to remain standing and not to sit prematurely           General ADL Comments: pt in chair requesting back to bed immediately. Ot and RN start to assist patient and pt request rest break , water and linens on bed changed. Pt with facial grimance and reports the only thing that  i need is pain medication. Pt educated on proper back precautions to progress from sitting in chair to bed transfer. Pt attempting to twist by reaching back with L UE with max cues and hand over hand to keep L UE in proper alignment. Pt positioned supine in bed for hygiene.      Vision      Perception     Praxis      Pertinent Vitals/Pain Pain Assessment: 0-10 Pain Score: 10-Worst pain ever Pain Location: back pain  Pain Descriptors / Indicators: Constant Pain Intervention(s): Limited activity within patient's tolerance;Repositioned;Monitored during session;Other (comment) (Rn notified of patients request for medicaiton)     Hand Dominance Right   Extremity/Trunk Assessment Upper Extremity Assessment Upper Extremity Assessment: Overall WFL for tasks assessed   Lower Extremity Assessment Lower Extremity Assessment: Defer to PT evaluation RLE Deficits / Details: unable to test formally due to pain. minimal hip flexion, knee/ankle okay LLE Deficits / Details: unable to test formally due to pain. minimal hip flexion, knee/ankle okay   Cervical / Trunk Assessment Cervical / Trunk Assessment:  (back pain)   Communication Communication Communication: No difficulties   Cognition Arousal/Alertness: Awake/alert Behavior During Therapy: WFL for tasks assessed/performed Overall Cognitive Status: Within Functional Limits for tasks assessed                     General Comments       Exercises       Shoulder Instructions      Home Living Family/patient expects to be discharged to:: Private residence Living Arrangements: Alone Available Help at Discharge: Family;Available PRN/intermittently Type of Home: Apartment Home Access: Level entry     Home Layout: One level     Bathroom Shower/Tub: Chief Strategy OfficerTub/shower unit   Bathroom Toilet: Handicapped height     Home Equipment: Environmental consultantWalker - 2 wheels;Bedside commode   Additional Comments: pt staying in apartment now, wife in house. 4-5 steps to enter with bilat hand rails, 1 story home      Prior Functioning/Environment Level of Independence: Independent        Comments: pt was independent until 2 weeks ago, wasn't able to walk for 1 week then started amb with RW with PT at [facility]    OT Diagnosis:  Generalized weakness;Acute pain   OT Problem List: Decreased strength;Decreased activity tolerance;Impaired balance (sitting and/or standing);Decreased safety awareness;Decreased knowledge of use of DME or AE;Decreased knowledge of precautions;Pain   OT Treatment/Interventions: Self-care/ADL training;Therapeutic exercise;DME and/or AE instruction;Therapeutic activities;Patient/family education;Balance training;Energy conservation    OT Goals(Current goals can be found in the care plan section) Acute Rehab OT Goals Patient Stated Goal: to get pain medication like i was getting it before OT Goal Formulation: With patient Time For Goal Achievement: 11/28/15 Potential to Achieve Goals: Good  OT Frequency: Min 2X/week   Barriers to D/C: Decreased caregiver support          Co-evaluation              End of Session Equipment Utilized During Treatment: Gait belt;Rolling walker Nurse Communication: Mobility status;Precautions  Activity Tolerance: Patient limited by pain Patient left: in bed;with bed alarm set;with call bell/phone within reach   Time: [id number] OT Time Calculation (min): 23 min Charges:  OT General Charges $OT Visit: 1 Procedure OT Evaluation $OT Eval Moderate Complexity: 1 Procedure G-Codes:    Boone MasterJones, Raunel Dimartino B 11/14/2015, 10:20 AM  Mateo FlowJones, Brynn   OTR/L Pager: [phone]: [phone] Office: [phone] .
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
## 2015-11-14 NOTE — Evaluation (Signed)
Physical Therapy Evaluation Patient Details Name: Evan Wolf MRN: [id number] DOB: 1961-06-17 Today's Date: 11/14/2015   History of Present Illness  55 year old male with a history of chronic back pain and BPH presented to [facility] for week history of acute on chronic low back pain with associated left leg weakness.. The patient was admitted to [facility] on 10/31/2015 and workup revealed acute osteomyelitis and discitis L1-L2. Patient underwent MRI of the lumbar spine 11/13/2015. There was concern of worsening discitis with abscess formation. As a result, the patient was transferred to [facility].  Clinical Impression  Pt with 10/10 extreme back pain greatly limiting mobility and ability to do ADLs. Suspect if pain can get under control patient will progress quickly with mobility and will be able to achieve safe mod I level to return home alone with family assisting PRN. However if pain is not under control and pt is medically ready to be d/c'd pt will need SNF as he is unsafe and unable to care for self at this level of pain. Pt currently requires modA for OOB mobility and can only tolerate 25 feet of ambulation with majority of weight through UEs.     Follow Up Recommendations Home health PT;Supervision/Assistance - 24 hour pt may need SNF if pt can't achieve safe mod I level of function or will not have 24/7 supervision.    Equipment Recommendations  None recommended by PT    Recommendations for Other Services       Precautions / Restrictions Precautions Precautions: Fall Precaution Comments: extreme back pain, educated on back precautions for pain management Restrictions Other Position/Activity Restrictions: pt self WBAT on bilat LEs due to increased pain      Mobility  Bed Mobility Overal bed mobility: Needs Assistance Bed Mobility: Rolling;Sidelying to Sit Rolling: Min guard Sidelying to sit: Mod assist       General bed mobility comments: max v/c's for log roll technique,  pt able to bring R knee up, v/c's to lead with head and UE  Transfers Overall transfer level: Needs assistance Equipment used: Rolling walker (2 wheeled) Transfers: Sit to/from Stand Sit to Stand: Mod assist         General transfer comment: lots of pain, increased time. assist to steady patient during transition of hands from bed to RW.  Ambulation/Gait Ambulation/Gait assistance: Min assist Ambulation Distance (Feet): 25 Feet Assistive device: Rolling walker (2 wheeled) Gait Pattern/deviations: Step-to pattern;Decreased stride length;Antalgic Gait velocity: extremely slow Gait velocity interpretation: Below normal speed for age/gender General Gait Details: pt suspending self with UEs and only WBing through bilat toes/ball of foot. v/c's to incrsease base of support, encouraged pt to place whole foot down, pt attempted and reported less pain  Stairs            Wheelchair Mobility    Modified Rankin (Stroke Patients Only)       Balance Overall balance assessment: Needs assistance Sitting-balance support: Feet supported;Bilateral upper extremity supported Sitting balance-Leahy Scale: Fair Sitting balance - Comments: pt leaning to L side, unable to tolerate sitting due to back pain   Standing balance support: Bilateral upper extremity supported Standing balance-Leahy Scale: Poor Standing balance comment: pt requires bilat UE assist and RW for safe standign                             Pertinent Vitals/Pain Pain Assessment: 0-10 Pain Score: 10-Worst pain ever Pain Location: across back at top of tail  bone Pain Descriptors / Indicators: Sharp Pain Intervention(s): Limited activity within patient's tolerance    Home Living Family/patient expects to be discharged to:: Private residence Living Arrangements: Alone (separated from wife but has a good relationship) Available Help at Discharge: Family;Available PRN/intermittently Type of Home: Apartment Home  Access: Level entry     Home Layout: One level Home Equipment: Walker - 2 wheels;Bedside commode Additional Comments: pt staying in apartment now, wife in house. 4-5 steps to enter with bilat hand rails, 1 story home    Prior Function Level of Independence: Independent         Comments: pt was independent until 2 weeks ago, wasn't able to walk for 1 week then started amb with RW with PT at [facility]     Hand Dominance   Dominant Hand: Right    Extremity/Trunk Assessment   Upper Extremity Assessment: Overall WFL for tasks assessed (pt suspends self in walker)           Lower Extremity Assessment: RLE deficits/detail;LLE deficits/detail RLE Deficits / Details: unable to test formally due to pain. minimal hip flexion, knee/ankle okay LLE Deficits / Details: unable to test formally due to pain. minimal hip flexion, knee/ankle okay  Cervical / Trunk Assessment:  (back pain)  Communication   Communication: No difficulties  Cognition Arousal/Alertness: Awake/alert Behavior During Therapy: WFL for tasks assessed/performed Overall Cognitive Status: Within Functional Limits for tasks assessed                      General Comments      Exercises        Assessment/Plan    PT Assessment Patient needs continued PT services  PT Diagnosis Difficulty walking;Acute pain;Generalized weakness   PT Problem List Decreased strength;Decreased activity tolerance;Decreased balance;Decreased mobility;Decreased knowledge of use of DME  PT Treatment Interventions     PT Goals (Current goals can be found in the Care Plan section) Acute Rehab PT Goals Patient Stated Goal: stop the pain PT Goal Formulation: With patient Time For Goal Achievement: 11/21/15 Potential to Achieve Goals: Good    Frequency Min 3X/week   Barriers to discharge Decreased caregiver support pt lives alone    Co-evaluation               End of Session Equipment Utilized During  Treatment: Gait belt Activity Tolerance: Patient limited by pain Patient left: in chair;with call bell/phone within reach;with chair alarm set Nurse Communication: Mobility status         Time: [phone] PT Time Calculation (min) (ACUTE ONLY): 31 min   Charges:   PT Evaluation $PT Eval Moderate Complexity: 1 Procedure PT Treatments $Gait Training: 8-22 mins   PT G CodesMarcene Brawn:        Calahan Pak Marie 11/14/2015, 9:43 AM   Lewis ShockAshly Ellaree Gear, PT, DPT Pager #: [phone] Office #: [phone]

## 2015-11-14 NOTE — Progress Notes (Signed)
Initial Nutrition Assessment  DOCUMENTATION CODES:   Not applicable  INTERVENTION:  Provide Boost Breeze po TID, each supplement provides 250 kcal and 9 grams of protein.  Provide 30 ml Prostat po BID, each supplement provides 100 kcal and 15 grams of protein.   Encourage adequate PO intake.   NUTRITION DIAGNOSIS:   Inadequate oral intake related to poor appetite as evidenced by meal completion < 25%.  GOAL:   Patient will meet greater than or equal to 90% of their needs  MONITOR:   PO intake, Supplement acceptance, Weight trends, Labs, I & O's  REASON FOR ASSESSMENT:   Malnutrition Screening Tool    ASSESSMENT:   55 year old male with a history of chronic back pain and BPH [facility] for week history of acute on chronic low back pain with associated left leg weakness.. [facility] and workup revealed acute osteomyelitis and discitis L1-L2. Discussed patient was prominent on 11/02/2015 and grew Streptococcus mitis. Antibiotics were narrowed to ceftriaxone. Patient underwent MRI of the lumbar spine 11/13/2015. There was concern of worsening discitis with abscess formation. As a result, the patient was transferred to [facility].  Meal completion has been 10%. Pt reports having a lack of appetite along with altered taste which has been ongoing over the past 2 weeks. Pt says he has been only able to consume some fruits and clear liquids as most other foods taste "off" to him. Pt reports weight loss with usual body weight of ~190 lbs. Per Epic weight records, pt with a 16.7% weight loss in 9 months. Pt is agreeable to nutritional supplements to aid in caloric and protein needs as well as in wound healing. Pt reports he has tried Ensure and dislikes it. RD to order Boost Breeze and Prostat. Pt was encouraged to eat his food at meals.   Pt with no observed significant fat or muscle mass loss.   Labs and medications reviewed.   Diet  Order:  Diet regular Room service appropriate?: Yes; Fluid consistency:: Thin  Skin:  Wound (see comment) (Unstageable pressure ulcer on sacrum)  Last BM:  Pt reports no BM since christmas 2016  Height:   Ht Readings from Last 1 Encounters:  11/13/15 6' (1.829 m)    Weight:   Wt Readings from Last 1 Encounters:  11/13/15 154 lb 0.7 oz (69.875 kg)    Ideal Body Weight:  80.9 kg  BMI:  Body mass index is 20.89 kg/(m^2).  Estimated Nutritional Needs:   Kcal:  2000-2200  Protein:  95-105 grams  Fluid:  2- 2.2 L/day  EDUCATION NEEDS:   No education needs identified at this time  Roslyn SmilingStephanie Nijah Tejera, MS, RD, LDN Pager # [phone] After hours/ weekend pager # [phone]

## 2015-11-14 NOTE — Plan of Care (Signed)
Problem: Activity: Goal: Risk for activity intolerance will decrease Pt ambulated from bed to door with 2 person standby assist with gait belt and front wheel walker.

## 2015-11-14 NOTE — Consult Note (Signed)
WOC wound consult note Reason for Consult: Consult requested for sacrum.  Pt states he developed a pressure injury to this location at another hospital last week. Wound type: 50% dark purple deep tissue injury, 50% unstageable yellow slough Pressure Ulcer POA: Yes Measurement: 3X2cm Drainage (amount, consistency, odor) No odor or drainage Periwound: Pt is very thin and sacral bone protrudes beneath skin over the location of the pressure injury Dressing procedure/placement/frequency: Santyl ointment to provide enzymatic debridement of nonviable tissue.  Air cushion when OOB to chair for pressure reduction.  Discussed repositioning off affected area when in bed and pt verbalized understanding. Please re-consult if further assistance is needed.  Thank-you,  Cammie Mcgee MSN, RN, CWOCN, [place], CNS [phone]

## 2015-11-14 NOTE — Progress Notes (Signed)
Patient ID: Evan JosephRandy L Colan, male   DOB: [birth year], 55 y.o.   MRN: [id number]         [facility]    Date of Admission:  11/13/2015   Total days of antibiotics 13         Principal Problem:   Lumbar discitis Active Problems:   Abscess in epidural space of lumbar spine   Left foot drop   BPH (benign prostatic hyperplasia)   Urinary retention   Chronic back pain   Pressure ulcer   Acute osteomyelitis (HCC)   . cefTRIAXone (ROCEPHIN)  IV  2 g Intravenous Q24H  . collagenase   Topical Daily  . docusate sodium  100 mg Oral BID  . enoxaparin (LOVENOX) injection  40 mg Subcutaneous Q24H  . feeding supplement  1 Container Oral TID BM  . feeding supplement (PRO-STAT SUGAR FREE 64)  30 mL Oral BID  . senna  2 tablet Oral Daily  . sodium chloride  3 mL Intravenous Q12H  . tamsulosin  0.8 mg Oral Daily    SUBJECTIVE: He is feeling a little bit better today. His back pain is not so severe. He has been up walking some with the aid of his walker.  Review of Systems: Review of Systems  Constitutional: Positive for malaise/fatigue. Negative for fever, chills and diaphoresis.  Gastrointestinal: Negative for nausea, vomiting and diarrhea.  Musculoskeletal: Positive for back pain.    Past Medical History  Diagnosis Date  . Chronic back pain   . Anxiety   . Arthritis     Social History  Substance Use Topics  . Smoking status: Never Smoker   . Smokeless tobacco: None  . Alcohol Use: No    Family History  Problem Relation Age of Onset  . Alzheimer's disease Father   . Parkinson's disease Father   . Diabetes Mellitus II Mother   . Deep vein thrombosis Mother    No Known Allergies  OBJECTIVE: Filed Vitals:   11/13/15 1953 11/14/15 0425 11/14/15 0830 11/14/15 1643  BP: 101/70 108/69 129/53 125/72  Pulse: 106 92 86 98  Temp: 98.7 F (37.1 C) 98.5 F (36.9 C) 98 F (36.7 C) 98.4 F (36.9 C)  TempSrc: Oral Oral Oral Oral  Resp: 18 16 14 16     Height:      Weight: 154 lb 0.7 oz (69.875 kg)     SpO2: 97% 97% 100% 98%   Body mass index is 20.89 kg/(m^2).  Physical Exam  Constitutional: He is oriented to person, place, and time. No distress.  Eyes: Conjunctivae are normal.  Cardiovascular: Normal rate and regular rhythm.   No murmur heard. Pulmonary/Chest: Breath sounds normal.  Abdominal: Soft. He exhibits no mass. There is no tenderness.  Musculoskeletal: Normal range of motion.  He is able to move around in bed and sit up with less pain today.  Neurological: He is alert and oriented to person, place, and time.  Skin: No rash noted.  Psychiatric: Mood and affect normal.    Lab Results Lab Results  Component Value Date   WBC 8.0 11/14/2015   HGB 14.4 11/14/2015   HCT 42.5 11/14/2015   MCV 87.4 11/14/2015   PLT 382 11/14/2015    Lab Results  Component Value Date   CREATININE 1.16 11/14/2015   BUN 26* 11/14/2015   NA 137 11/14/2015   K 4.4 11/14/2015   CL 97* 11/14/2015   CO2 31 11/14/2015    Lab Results  Component Value Date   ALT 24 11/13/2015   AST 22 11/13/2015   ALKPHOS 78 11/13/2015   BILITOT 0.7 11/13/2015     Microbiology: No results found for this or any previous visit (from the past 240 hour(s)).   ASSESSMENT: He is improving slowly on therapy for streptococcal lumbar infection. Once he is safely ambulating and his pain is under control with oral pain medications he can be discharged with follow-up in my clinic.  PLAN: 1. Continue ceftriaxone  Cliffton Asters, MD [facility] for Infectious [facility] [phone] pager   [phone] cell 11/14/2015, 5:13 PM

## 2015-11-15 DIAGNOSIS — M21372 Foot drop, left foot: Secondary | ICD-10-CM

## 2015-11-15 DIAGNOSIS — R339 Retention of urine, unspecified: Secondary | ICD-10-CM

## 2015-11-15 DIAGNOSIS — L8990 Pressure ulcer of unspecified site, unspecified stage: Secondary | ICD-10-CM

## 2015-11-15 LAB — CBC
HEMATOCRIT: 40.3 % (ref 39.0–52.0)
HEMOGLOBIN: 13.8 g/dL (ref 13.0–17.0)
MCH: 29.8 pg (ref 26.0–34.0)
MCHC: 34.2 g/dL (ref 30.0–36.0)
MCV: 87 fL (ref 78.0–100.0)
Platelets: 420 10*3/uL — ABNORMAL HIGH (ref 150–400)
RBC: 4.63 MIL/uL (ref 4.22–5.81)
RDW: 12.3 % (ref 11.5–15.5)
WBC: 6.8 10*3/uL (ref 4.0–10.5)

## 2015-11-15 MED ORDER — OXYCODONE HCL 5 MG PO TABS
5.0000 mg | ORAL_TABLET | ORAL | Status: DC | PRN
  Administered 2015-11-15 – 2015-11-17 (×4): 10 mg via ORAL
  Filled 2015-11-15 (×4): qty 2

## 2015-11-15 MED ORDER — OXYCODONE HCL ER 15 MG PO T12A
15.0000 mg | EXTENDED_RELEASE_TABLET | Freq: Two times a day (BID) | ORAL | Status: DC
  Administered 2015-11-15 – 2015-11-17 (×5): 15 mg via ORAL
  Filled 2015-11-15 (×5): qty 1

## 2015-11-15 NOTE — Care Management Note (Signed)
Case Management Note  Patient Details  Name: Evan Wolf MRN: [id number] Date of Birth: 1961-10-24  Subjective/Objective:      CM following for progression and d/c planning.              Action/Plan: 11/15/2015 Met with pt to discuss d/c needs. Pt wife is able to assist some , however works and he would be alone during the day. Pt will also need IV antibiotics daily, he has a PICC in place. Spoke with pt re possible short term SNF for rehab. CSW will work with pt re possible SNF, will discuss CIR with PT as an option.  Have asked [facility] to see pt re HH IV antibiotics in case he is able to d/c to home. Will continue to follow and arrange services a needed.   Expected Discharge Date:  11/16/15               Expected Discharge Plan:     In-House Referral:  Clinical Social Work  Discharge planning Services  CM Consult  Post Acute Care Choice:  Durable Medical Equipment Choice offered to:     DME Arranged:    DME Agency:     HH Arranged:    [place] Agency:     Status of Service:  In process, will continue to follow  Medicare Important Message Given:    Date Medicare IM Given:    Medicare IM give by:    Date Additional Medicare IM Given:    Additional Medicare Important Message give by:     If discussed at [place] of Stay Meetings, dates discussed:    Additional Comments:  Adron Bene, RN 11/15/2015, 10:18 AM

## 2015-11-15 NOTE — Clinical Documentation Improvement (Signed)
Family Medicine  Can the diagnosis of pressure ulcer be further specified?  Wound nurse states pressure ulcer of sacral area without a stage.  Pt states that he acquired at another hospital last week.   Pressure ulcer of sacral area, unstaged.  Other  Clinically Undetermined    Supporting Information: See Wound nurse consult note.   Please exercise your independent, professional judgment when responding. A specific answer is not anticipated or expected.   Thank Modesta MessingYou,  Estus Krakowski L [facility] [place] [phone]

## 2015-11-15 NOTE — Progress Notes (Signed)
Physical Therapy Treatment Patient Details Name: Evan Wolf MRN: [id number] DOB: 04-15-1961 Today's Date: 11/15/2015    History of Present Illness 55 year old male with a history of chronic back pain and BPH presented to [facility] for week history of acute on chronic low back pain with associated left leg weakness.. The patient was admitted to [facility] on 10/31/2015 and workup revealed acute osteomyelitis and discitis L1-L2. Patient underwent MRI of the lumbar spine 11/13/2015. There was concern of worsening discitis with abscess formation. As a result, the patient was transferred to [facility].    PT Comments    Pt significantly improved from mobility stand point despite pain being 9/10 pain in back. Pt able to transfer with min guard and ambulate 120' with RW with min guard. Pt still unable to tolerate sitting due to significant back pain and relies heavily on UEs during ambulation. Anticipate pt to be safe to d/c home with recommended DME and spouse to assist him PRN.  Follow Up Recommendations  Home health PT;Supervision - Intermittent     Equipment Recommendations  Rolling walker with 5" wheels;3in1 (PT)    Recommendations for Other Services       Precautions / Restrictions Precautions Precautions: Fall Precaution Comments: educated on back precautions Restrictions Weight Bearing Restrictions: No Other Position/Activity Restrictions: pt self WBAT on bilat LEs due to increased pain    Mobility  Bed Mobility Overal bed mobility: Needs Assistance Bed Mobility: Rolling Rolling: Supervision Sidelying to sit: Supervision       General bed mobility comments: increased time, definite use of bed rail but no physical assist  Transfers Overall transfer level: Needs assistance Equipment used: Rolling walker (2 wheeled) Transfers: Sit to/from Stand Sit to Stand: Min guard         General transfer comment: v/c's to push up from bed however pt states "i have to do it this  way"  Ambulation/Gait Ambulation/Gait assistance: Min guard Ambulation Distance (Feet): 120 Feet Assistive device: Rolling walker (2 wheeled) Gait Pattern/deviations: Decreased step length - left;Decreased stance time - right;Shuffle;Antalgic;Narrow base of support Gait velocity: slow Gait velocity interpretation: Below normal speed for age/gender General Gait Details: pt reports 60% WBing on UEs and 40% through LEs, pt unable to tolerate much WBing through R LE therefore unable to increase L LE step length. v/c's to look forward not down at feet.   Stairs            Wheelchair Mobility    Modified Rankin (Stroke Patients Only)       Balance Overall balance assessment: Needs assistance Sitting-balance support: Bilateral upper extremity supported;Feet supported Sitting balance-Leahy Scale: Poor Sitting balance - Comments: unable to tolerate WBing on R side, uses UEs to off weight and lean to L   Standing balance support: Bilateral upper extremity supported Standing balance-Leahy Scale: Poor Standing balance comment: requires UE support via RW                    Cognition Arousal/Alertness: Awake/alert Behavior During Therapy: WFL for tasks assessed/performed Overall Cognitive Status: Within Functional Limits for tasks assessed                      Exercises      General Comments        Pertinent Vitals/Pain Pain Assessment: 0-10 Pain Score: 9  Pain Location: back pain (with R LE WBing) Pain Descriptors / Indicators: Constant Pain Intervention(s): Monitored during session    Home Living  Prior Function            PT Goals (current goals can now be found in the care plan section) Acute Rehab PT Goals PT Goal Formulation: With patient Time For Goal Achievement: 11/21/15 Potential to Achieve Goals: Good Progress towards PT goals: Progressing toward goals    Frequency  Min 3X/week    PT Plan Discharge  plan needs to be updated    Co-evaluation             End of Session Equipment Utilized During Treatment: Gait belt Activity Tolerance: Patient limited by pain Patient left: in chair;with call bell/phone within reach;with chair alarm set     Time: 1040-1109 PT Time Calculation (min) (ACUTE ONLY): 29 min  Charges:  $Gait Training: 23-37 mins                    G Codes:      Marcene BrawnChadwell, Jontue Crumpacker Marie 11/15/2015, 11:28 AM   Lewis ShockAshly Edson Deridder, PT, DPT Pager #: [phone] Office #: [phone]
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
## 2015-11-15 NOTE — Progress Notes (Signed)
Patient ID: Evan Wolf, male   DOB: 1961-10-20, 55 y.o.   MRN: [id number]         [facility]    Date of Admission:  11/13/2015   Total days of antibiotics 14         Principal Problem:   Lumbar discitis Active Problems:   Abscess in epidural space of lumbar spine   Left foot drop   BPH (benign prostatic hyperplasia)   Urinary retention   Chronic back pain   Pressure ulcer   Acute osteomyelitis (HCC)   . cefTRIAXone (ROCEPHIN)  IV  2 g Intravenous Q24H  . collagenase   Topical Daily  . docusate sodium  100 mg Oral BID  . enoxaparin (LOVENOX) injection  40 mg Subcutaneous Q24H  . feeding supplement  1 Container Oral TID BM  . feeding supplement (PRO-STAT SUGAR FREE 64)  30 mL Oral BID  . oxyCODONE  15 mg Oral Q12H  . senna  2 tablet Oral Daily  . sodium chloride  3 mL Intravenous Q12H  . tamsulosin  0.8 mg Oral Daily    SUBJECTIVE: He states that he is seeing a little bit of progress each day. His back pain is under better control and he is been able to walk the length of the hallway with the assistance of a walker and the physical therapist. His appetite is improving.  Review of Systems: Review of Systems  Constitutional: Positive for malaise/fatigue. Negative for fever, chills and diaphoresis.  Gastrointestinal: Negative for nausea, vomiting and diarrhea.  Musculoskeletal: Positive for back pain.  Neurological: Negative for focal weakness.  Psychiatric/Behavioral: Negative for depression.    Past Medical History  Diagnosis Date  . Chronic back pain   . Anxiety   . Arthritis     Social History  Substance Use Topics  . Smoking status: Never Smoker   . Smokeless tobacco: None  . Alcohol Use: No    Family History  Problem Relation Age of Onset  . Alzheimer's disease Father   . Parkinson's disease Father   . Diabetes Mellitus II Mother   . Deep vein thrombosis Mother    No Known Allergies  OBJECTIVE: Filed Vitals:   11/14/15  1643 11/14/15 2105 11/15/15 0426 11/15/15 0945  BP: 125/72 123/74 118/73 122/75  Pulse: 98 88 93 89  Temp: 98.4 F (36.9 C) 98.8 F (37.1 C) 98.7 F (37.1 C) 98.2 F (36.8 C)  TempSrc: Oral Oral Oral Oral  Resp: 16 16 16 16   Height:      Weight:  153 lb 4.2 oz (69.518 kg)    SpO2: 98% 99% 98% 98%   Body mass index is 20.78 kg/(m^2).  Physical Exam  Constitutional: He is oriented to person, place, and time. No distress.  He is alert, comfortable and talkative. He is watching television.  Eyes: Conjunctivae are normal.  Cardiovascular: Normal rate and regular rhythm.   No murmur heard. Pulmonary/Chest: Breath sounds normal.  Abdominal: Soft. He exhibits no mass. There is no tenderness.  Musculoskeletal: Normal range of motion.  Neurological: He is alert and oriented to person, place, and time.  Skin: No rash noted.  Psychiatric: Mood and affect normal.    Lab Results Lab Results  Component Value Date   WBC 6.8 11/15/2015   HGB 13.8 11/15/2015   HCT 40.3 11/15/2015   MCV 87.0 11/15/2015   PLT 420* 11/15/2015    Lab Results  Component Value Date   CREATININE 1.16  11/14/2015   BUN 26* 11/14/2015   NA 137 11/14/2015   K 4.4 11/14/2015   CL 97* 11/14/2015   CO2 31 11/14/2015    Lab Results  Component Value Date   ALT 24 11/13/2015   AST 22 11/13/2015   ALKPHOS 78 11/13/2015   BILITOT 0.7 11/13/2015     Microbiology: No results found for this or any previous visit (from the past 240 hour(s)).   ASSESSMENT: He continues to improve. Once he is able to manage his pain with oral antibiotics and safely ambulate he can be discharged with follow-up in my clinic.  PLAN: 1. Continue ceftriaxone at least 4 more weeks through 12/13/2015  Evan AstersJohn Roylee Chaffin, MD [facility] [phone] pager   [phone] cell 11/15/2015, 5:47 PM

## 2015-11-15 NOTE — Progress Notes (Signed)
[facility]  [facility]  [facility] is providing the following services: Home health services and home infusion pharmacy for home IV ABX.  [facility]  will follow pt until DC to support home health needs and  home IV ABX if ordered.   If patient discharges after hours, please call [phone].   Sedalia Mutaamela S Chandler 11/15/2015, 2:46 PM

## 2015-11-16 DIAGNOSIS — M549 Dorsalgia, unspecified: Secondary | ICD-10-CM

## 2015-11-16 DIAGNOSIS — G8929 Other chronic pain: Secondary | ICD-10-CM

## 2015-11-16 LAB — BASIC METABOLIC PANEL
ANION GAP: 6 (ref 5–15)
BUN: 16 mg/dL (ref 6–20)
CALCIUM: 9.2 mg/dL (ref 8.9–10.3)
CO2: 29 mmol/L (ref 22–32)
Chloride: 104 mmol/L (ref 101–111)
Creatinine, Ser: 0.94 mg/dL (ref 0.61–1.24)
GFR calc non Af Amer: >60 mL/min (ref >60)
Glucose, Bld: 109 mg/dL — ABNORMAL HIGH (ref 65–99)
POTASSIUM: 3.6 mmol/L (ref 3.5–5.1)
Sodium: 139 mmol/L (ref 135–145)

## 2015-11-16 LAB — CBC
HCT: 39.4 % (ref 39.0–52.0)
HEMOGLOBIN: 13.1 g/dL (ref 13.0–17.0)
MCH: 29.2 pg (ref 26.0–34.0)
MCHC: 33.2 g/dL (ref 30.0–36.0)
MCV: 87.8 fL (ref 78.0–100.0)
Platelets: 343 10*3/uL (ref 150–400)
RBC: 4.49 MIL/uL (ref 4.22–5.81)
RDW: 12.2 % (ref 11.5–15.5)
WBC: 5.9 10*3/uL (ref 4.0–10.5)

## 2015-11-16 NOTE — Progress Notes (Signed)
Patient ID: Evan Wolf, male   DOB: 04-04-1961, 55 y.o.   MRN: [id number]         [facility]    Date of Admission:  11/13/2015   Total days of antibiotics 15         Principal Problem:   Lumbar discitis Active Problems:   Abscess in epidural space of lumbar spine   Left foot drop   BPH (benign prostatic hyperplasia)   Urinary retention   Chronic back pain   Pressure ulcer   Acute osteomyelitis (HCC)   . cefTRIAXone (ROCEPHIN)  IV  2 g Intravenous Q24H  . collagenase   Topical Daily  . docusate sodium  100 mg Oral BID  . enoxaparin (LOVENOX) injection  40 mg Subcutaneous Q24H  . feeding supplement  1 Container Oral TID BM  . feeding supplement (PRO-STAT SUGAR FREE 64)  30 mL Oral BID  . oxyCODONE  15 mg Oral Q12H  . senna  2 tablet Oral Daily  . sodium chloride  3 mL Intravenous Q12H  . tamsulosin  0.8 mg Oral Daily    SUBJECTIVE: He is feeling better today. Had a bowel movement. His pain is under better control. He walked in the hallway and climbed stairs with physical therapy today.  Review of Systems: Review of Systems  Constitutional: Positive for malaise/fatigue. Negative for fever, chills and diaphoresis.  Gastrointestinal: Negative for nausea, vomiting and diarrhea.  Musculoskeletal: Positive for back pain.  Neurological: Negative for focal weakness.  Psychiatric/Behavioral: Negative for depression.    Past Medical History  Diagnosis Date  . Chronic back pain   . Anxiety   . Arthritis     Social History  Substance Use Topics  . Smoking status: Never Smoker   . Smokeless tobacco: None  . Alcohol Use: No    Family History  Problem Relation Age of Onset  . Alzheimer's disease Father   . Parkinson's disease Father   . Diabetes Mellitus II Mother   . Deep vein thrombosis Mother    No Known Allergies  OBJECTIVE: Filed Vitals:   11/15/15 0426 11/15/15 0945 11/15/15 2032 11/16/15 0526  BP: 118/73 122/75 110/68 104/59    Pulse: 93 89 94 93  Temp: 98.7 F (37.1 C) 98.2 F (36.8 C) 98.6 F (37 C) 98.2 F (36.8 C)  TempSrc: Oral Oral    Resp: 16 16 21 17   Height:      Weight:   160 lb (72.576 kg)   SpO2: 98% 98% 100% 98%   Body mass index is 21.7 kg/(m^2).  Physical Exam  Constitutional: He is oriented to person, place, and time. No distress.  He is smiling and in good spirits.  Eyes: Conjunctivae are normal.  Cardiovascular: Normal rate and regular rhythm.   No murmur heard. Pulmonary/Chest: Breath sounds normal.  Abdominal: Soft. There is no tenderness.  Musculoskeletal: Normal range of motion.  Neurological: He is alert and oriented to person, place, and time.  Skin: No rash noted.  Psychiatric: Mood and affect normal.    Lab Results Lab Results  Component Value Date   WBC 5.9 11/16/2015   HGB 13.1 11/16/2015   HCT 39.4 11/16/2015   MCV 87.8 11/16/2015   PLT 343 11/16/2015    Lab Results  Component Value Date   CREATININE 0.94 11/16/2015   BUN 16 11/16/2015   NA 139 11/16/2015   K 3.6 11/16/2015   CL 104 11/16/2015   CO2 29 11/16/2015  Lab Results  Component Value Date   ALT 24 11/13/2015   AST 22 11/13/2015   ALKPHOS 78 11/13/2015   BILITOT 0.7 11/13/2015     Microbiology: No results found for this or any previous visit (from the past 240 hour(s)).   ASSESSMENT: He continues to improve. I will sign off now and arrange follow-up in my clinic within the next 3-4 weeks.  PLAN: 1. Continue ceftriaxone at least 4 more weeks through 12/13/2015  Cliffton AstersJohn Yitta Gongaware, MD [facility] [phone] pager   [phone] cell 11/16/2015, 4:07 PM
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
## 2015-11-16 NOTE — Progress Notes (Addendum)
Physical Therapy Treatment Patient Details Name: Evan Wolf MRN: [id number] DOB: July 22, 1961 Today's Date: 11/16/2015    History of Present Illness 55 year old male with a history of chronic back pain and BPH presented to [facility] for week history of acute on chronic low back pain with associated left leg weakness.. The patient was admitted to [facility] on 10/31/2015 and workup revealed acute osteomyelitis and discitis L1-L2. Patient underwent MRI of the lumbar spine 11/13/2015. There was concern of worsening discitis with abscess formation. As a result, the patient was transferred to [facility].    PT Comments    Progressing towards short term goals. Tolerated increased walking distance today w/ Mod I/Supervision. Pt remains reliant on RW to offload LE. Able to transfer and complete all bed mobility with Mod I and extra time to prevent increases in pain. Attempted stairs requiring Mod A. Pt will have support at home once D/C'd and can return to a home w/o stairs. Recommending OPPT once medically cleared to continue towards regaining baseline independence.   Follow Up Recommendations  Home Health PT - Addended (Pt lives alone. Will not have resources to safely transport to and from OPPT clinic)     Equipment Recommendations  Rolling walker with 5" wheels    Recommendations for Other Services       Precautions / Restrictions Precautions Precautions: Fall Precaution Comments: educated on back precautions Restrictions Weight Bearing Restrictions: No Other Position/Activity Restrictions: pt self WBAT on bilat LEs due to increased pain    Mobility  Bed Mobility Overal bed mobility: Modified Independent Bed Mobility: Rolling;Supine to Sit;Sit to Supine Rolling: Supervision   Supine to sit: Modified independent (Device/Increase time) Sit to supine: Min assist   General bed mobility comments: increased time, need for bed rail to push into sitting, able to complete w/ supervision and  minimal VC's  Transfers Overall transfer level: Needs assistance Equipment used: Rolling walker (2 wheeled) Transfers: Sit to/from Stand Sit to Stand: Min assist         General transfer comment: VC's to bring legs back and push from the bed however pt relies on RW to stand  Ambulation/Gait Ambulation/Gait assistance: Supervision Ambulation Distance (Feet): 150 Feet Assistive device: Rolling walker (2 wheeled) Gait Pattern/deviations: Decreased step length - left;Step-to pattern;Step-through pattern;Antalgic;Narrow base of support Gait velocity: slow Gait velocity interpretation: Below normal speed for age/gender General Gait Details: Pt suspends himself with UE on RW stated about 50%. VCs to increase BOS, and step through w/ L LE   Stairs Stairs: Yes Stairs assistance: Mod assist Stair Management: One rail Right;Step to pattern;Sideways Number of Stairs: 4 General stair comments: Labored effort, Difficulty positioning walker and body to ascend stairs, heavy reliance on UE on railing, VC's for foot placement on stair  Wheelchair Mobility    Modified Rankin (Stroke Patients Only)       Balance Overall balance assessment: Needs assistance Sitting-balance support: No upper extremity supported;Feet unsupported Sitting balance-Leahy Scale: Good     Standing balance support: Bilateral upper extremity supported Standing balance-Leahy Scale: Fair Standing balance comment: constant UE support on RW                    Cognition Arousal/Alertness: Awake/alert Behavior During Therapy: WFL for tasks assessed/performed Overall Cognitive Status: Within Functional Limits for tasks assessed                      Exercises      General Comments  Pertinent Vitals/Pain Pain Assessment: 0-10 Pain Score: 7  Pain Location: back pain srright across LB Pain Descriptors / Indicators: Constant Pain Intervention(s): Monitored during session;Limited activity  within patient's tolerance    Home Living                      Prior Function            PT Goals (current goals can now be found in the care plan section) Acute Rehab PT Goals Patient Stated Goal: go home today Progress towards PT goals: Progressing toward goals    Frequency  Min 3X/week    PT Plan Discharge plan needs to be updated    Co-evaluation             End of Session Equipment Utilized During Treatment: Gait belt Activity Tolerance: Patient limited by pain Patient left: in bed;with call bell/phone within reach     Time: 1002-1024 PT Time Calculation (min) (ACUTE ONLY): 22 min  Charges:  $Gait Training: 8-22 mins                    G Codes:      Berton MountBarbour, Hadyn Azer S 11/16/2015, 11:55 AM   Ulyses Jarredebekah Rubin, Student Physical Therapist Acute Rehab [phone]  Addendum for d/c recommendations to HHPT - spoke with case manager. Pt will not have resources to safely transport to outpatient PT as he lives alone. He will require HHPT until this can be arranged.

## 2015-11-17 LAB — BASIC METABOLIC PANEL
Anion gap: 8 (ref 5–15)
BUN: 16 mg/dL (ref 6–20)
CHLORIDE: 102 mmol/L (ref 101–111)
CO2: 28 mmol/L (ref 22–32)
Calcium: 9.2 mg/dL (ref 8.9–10.3)
Creatinine, Ser: 0.98 mg/dL (ref 0.61–1.24)
Glucose, Bld: 91 mg/dL (ref 65–99)
POTASSIUM: 3.8 mmol/L (ref 3.5–5.1)
SODIUM: 138 mmol/L (ref 135–145)

## 2015-11-17 MED ORDER — BOOST / RESOURCE BREEZE PO LIQD: 1.0000 | Freq: Three times a day (TID) | ORAL | Status: DC

## 2015-11-17 MED ORDER — OXYCODONE HCL 10 MG PO TABS: 5.0000 mg | ORAL_TABLET | ORAL | Status: DC | PRN

## 2015-11-17 MED ORDER — COLLAGENASE 250 UNIT/GM EX OINT: TOPICAL_OINTMENT | Freq: Every day | CUTANEOUS | Status: DC

## 2015-11-17 MED ORDER — OXYCODONE HCL ER 15 MG PO T12A: 15.0000 mg | EXTENDED_RELEASE_TABLET | Freq: Two times a day (BID) | ORAL | Status: DC

## 2015-11-17 MED ORDER — PRO-STAT SUGAR FREE PO LIQD: 30.0000 mL | Freq: Two times a day (BID) | ORAL | Status: DC

## 2015-11-17 MED ORDER — DEXTROSE 5 % IV SOLN: 2.0000 g | INTRAVENOUS | Status: AC

## 2015-11-17 MED ORDER — SENNA 8.6 MG PO TABS: 2.0000 | ORAL_TABLET | Freq: Every day | ORAL | Status: AC

## 2015-11-17 MED ORDER — HEPARIN SOD (PORK) LOCK FLUSH 100 UNIT/ML IV SOLN
250.0000 [IU] | INTRAVENOUS | Status: AC | PRN
Start: 2015-11-17 — End: 2015-11-17
  Administered 2015-11-17: 250 [IU]

## 2015-11-17 NOTE — Progress Notes (Signed)
Occupational Therapy Treatment Patient Details Name: Evan Wolf MRN: [id number] DOB: Aug 01, 1961 Today's Date: 11/17/2015    History of present illness 55 year old male with a history of chronic back pain and BPH presented to [facility] for week history of acute on chronic low back pain with associated left leg weakness.. The patient was admitted to [facility] on 10/31/2015 and workup revealed acute osteomyelitis and discitis L1-L2. Patient underwent MRI of the lumbar spine 11/13/2015. There was concern of worsening discitis with abscess formation. As a result, the patient was transferred to [facility].   OT comments  Patient making slow progress towards goals, will continue plan of care for now. Pt continues to be limited by increased pain and during sitting and standing activities (can be static or dynamic) pt relies heavily on his BUEs. See below under ADL comment for information regarding this therapy session. Continue to recommend ST-SNF, pt reports that his wife will not be home 24/7 during the week because she works. When pt is up and OOB, he does need min guard assist for his safety.    Follow Up Recommendations  SNF;Supervision/Assistance - 24 hour (IF pt ends up going home he will need HHOT)    Equipment Recommendations  3 in 1 bedside comode;Other (comment);Tub/shower bench (RW; pt reports his wife plans to purchase a tub transfer bench)    Recommendations for Other Services  None at this time   Precautions / Restrictions Precautions Precautions: Fall Precaution Comments: educated on back precautions, pt unable to recall back precautions from previous sessions Restrictions Weight Bearing Restrictions: No Other Position/Activity Restrictions: pt self WBAT on bilat LEs due to increased pain; pt tends to put most weight through BUEs on bed or RW during sitting and standing    Mobility Bed Mobility Overal bed mobility: Modified Independent Bed Mobility: Rolling;Supine to Sit;Sit to  Supine General bed mobility comments: Increased time, need for bed rail to push into sitting, able to complete at mod I level. Pt did require cues for breathing.   Transfers Overall transfer level: Needs assistance Equipment used: Rolling walker (2 wheeled) Transfers: Sit to/from Stand Sit to Stand: Min guard  General transfer comment: Min guard for safety into standing. Cues for hand placement, technique, and overall safety. Pt with difficult time pushing from bed and grabbing onto walker, pt tended to just pull up on RW. Therapist explained fall risk with this and encouraged patient NOT to do this.     Balance Overall balance assessment: Needs assistance Sitting-balance support: No upper extremity supported;Feet supported Sitting balance-Leahy Scale: Zero Sitting balance - Comments: unable to tolerate WBing on R side, uses UEs to off weight and lean to L. With BUE support, sitting balance is fair.    Standing balance support: Bilateral upper extremity supported;During functional activity Standing balance-Leahy Scale: Fair Standing balance comment: Pt heavily relies on RW for support. Pt pushing a lot of weight through arms and has a difficult time moving RW due to this.    ADL Overall ADL's : Needs assistance/impaired Eating/Feeding: Set up;Bed level Eating/Feeding Details (indicate cue type and reason): Pt unable to tolerate sitting in recliner due to pain. Pt unable to sit EOB without UE support due to pain.  Grooming: Set up;Bed level   Upper Body Bathing: Moderate assistance;Sitting Upper Body Bathing Details (indicate cue type and reason): Pt with difficulty maintain dynamic sitting balance EOB due to pain, pt had difficult time lifing up one arm off bed      Upper Body  Dressing : Moderate assistance;Sitting Upper Body Dressing Details (indicate cue type and reason): Pt with difficulty maintain dynamic sitting balance EOB due to pain, pt had difficult time lifing up one arm off  bed  Lower Body Dressing: Sit to/from stand;Moderate assistance;Bed level Lower Body Dressing Details (indicate cue type and reason): Pt laying in bed crossing feet, pt unable to tolerate sitting EOB to perform LB ADLs.  Toilet Transfer: Minimal assistance;Ambulation;RW;BSC   Toileting- Clothing Manipulation and Hygiene: Total assistance;Sit to/from Nurse, children's Details (indicate cue type and reason): did not occur, but did discuss use of tub transfer bench in tub/shower and administerred educational handout on this Functional mobility during ADLs: Minimal assistance;Cueing for safety;Cueing for sequencing;Rolling walker General ADL Comments: Pt found supine in bed. Pt with increased pain and states he can't stand to sit, only lay in supine position or stand. Pt engaged in bed mobility and sat EOB. Pt unable to tolerate sitting EOB for long and required UB support while seated EOB due to pain. Pt stood with RW and ambulated into BR for toielt transfer using 3-n-1. During functional ambulation pt heabvily relied on BUE, to the extent where it was difficult for patient to push the RW.      Cognition   Behavior During Therapy: WFL for tasks assessed/performed Overall Cognitive Status: Within Functional Limits for tasks assessed                Pertinent Vitals/ Pain       Pain Assessment: Faces Faces Pain Scale: Hurts even more Pain Location: Back pain Pain Descriptors / Indicators: Aching;Discomfort;Grimacing;Guarding Pain Intervention(s): Limited activity within patient's tolerance;Monitored during session;Premedicated before session;Other (comment) (discussed positioning techniques to decrease pain)   Frequency Min 2X/week     Progress Toward Goals  OT Goals(current goals can now befound in the care plan section)  Progress towards OT goals: Progressing toward goals  Acute Rehab OT Goals Patient Stated Goal: go home soon OT Goal Formulation: With patient Time  For Goal Achievement: 11/28/15 Potential to Achieve Goals: Good  Plan Discharge plan remains appropriate    End of Session Equipment Utilized During Treatment: Gait belt;Rolling walker   Activity Tolerance Patient limited by pain   Patient Left in bed;with call bell/phone within reach (no bed alarm set when therapist entering room, pt aware not to get up without assistance)    Time: 4098-1191 OT Time Calculation (min): 24 min  Charges: OT General Charges $OT Visit: 1 Procedure OT Treatments $Self Care/Home Management : 23-37 mins  Edwin Cap , MS, OTR/L, CLT Pager: [phone]  11/17/2015, 9:38 AM

## 2015-11-17 NOTE — Progress Notes (Signed)
Evan Wolf to be D/C'd Home per MD order.  Discussed prescriptions and follow up appointments with the patient. Prescriptions given to patient, medication list explained in detail. Pt verbalized understanding.    Medication List    STOP taking these medications        HYDROcodone-acetaminophen 5-325 MG tablet  Commonly known as:  NORCO      TAKE these medications        cefTRIAXone 2 g in dextrose 5 % 50 mL  Inject 2 g into the vein daily.     collagenase ointment  Commonly known as:  SANTYL  Apply topically daily.     cyclobenzaprine 10 MG tablet  Commonly known as:  FLEXERIL  Take 10 mg by mouth 3 (three) times daily as needed for muscle spasms.     feeding supplement (PRO-STAT SUGAR FREE 64) Liqd  Take 30 mLs by mouth 2 (two) times daily.     feeding supplement Liqd  Take 1 Container by mouth 3 (three) times daily between meals.     gabapentin 300 MG capsule  Commonly known as:  NEURONTIN  Take 300 mg by mouth 2 (two) times daily.     meloxicam 15 MG tablet  Commonly known as:  MOBIC  Take 15 mg by mouth daily.     naproxen 500 MG tablet  Commonly known as:  NAPROSYN  Take 1 tablet (500 mg total) by mouth 2 (two) times daily as needed for mild pain, moderate pain or headache (TAKE WITH MEALS.).     Oxycodone HCl 10 MG Tabs  Take 0.5-1 tablets (5-10 mg total) by mouth every 4 (four) hours as needed for moderate pain.     oxyCODONE 15 mg 12 hr tablet  Commonly known as:  OXYCONTIN  Take 1 tablet (15 mg total) by mouth every 12 (twelve) hours.     senna 8.6 MG Tabs tablet  Commonly known as:  SENOKOT  Take 2 tablets (17.2 mg total) by mouth daily.     tamsulosin 0.4 MG Caps capsule  Commonly known as:  FLOMAX  Take 0.4 mg by mouth daily.        Filed Vitals:   11/16/15 2126 11/17/15 0549  BP: 108/64 114/71  Pulse: 75 84  Temp: 98.2 F (36.8 C) 98.5 F (36.9 C)  Resp: 19 20    Skin clean, dry and intact without evidence of skin break down, no  evidence of skin tears noted.Patient will go home with PICC and [facility]. Site without signs and symptoms of complications. . Pt denies pain at this time. No complaints noted.  An After Visit Summary was printed and given to the patient. Patient escorted via WC, and D/C home via private auto.  Janeann ForehandLuke Kebrina Wolf BSN, RN
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
## 2015-11-17 NOTE — Care Management Note (Signed)
Case Management Note  Patient Details  Name: Evan Wolf MRN: [id number] Date of Birth: 1961-05-08  Subjective/Objective:                 CM following for progression and d/c planning.   Action/Plan: 11/15/2015 Met with pt re HH need and IV antibiotics. Pt selected [facility] for [facility] services and Melvin notified, [facility] rep for IV antibiotics saw pt and began education process for IV antibiotics. Pt has PICC in place. 11/17/2015 Pt for d/c today , [facility] providing HHRN and HHPT, IV antibiotics to be deliver to home. Pt states that his wife has a walker, 3:1 and shower chair available for his use at home.  Expected Discharge Date:  11/16/15               Expected Discharge Plan:  [place]  In-House Referral:  NA  Discharge planning Services  CM Consult  Post Acute Care Choice:  NA Choice offered to:  NA  DME Arranged:   NA DME Agency:   NA  HH Arranged:  RN, PT HH Agency:   [facility]  Status of Service:  Completed, signed off  Medicare Important Message Given:    Date Medicare IM Given:    Medicare IM give by:    Date Additional Medicare IM Given:    Additional Medicare Important Message give by:     If discussed at [place] of Stay Meetings, dates discussed:    Additional Comments:  Adron Bene, RN 11/17/2015, 5:24 PM

## 2015-11-20 MED ORDER — MORPHINE SULFATE ER 15 MG PO TBCR: 15.0000 mg | EXTENDED_RELEASE_TABLET | Freq: Two times a day (BID) | ORAL | Status: DC

## 2015-11-28 ENCOUNTER — Telehealth: Payer: Self-pay | Admitting: *Deleted

## 2015-11-28 NOTE — Telephone Encounter (Signed)
Received disability paperwork to be completed on December 06, 2015 office visit.  Paperwork placed in Dr. Blair Dolphin box for completion on 12/06/15.

## 2015-12-06 ENCOUNTER — Encounter: Payer: Self-pay | Admitting: Internal Medicine

## 2015-12-12 ENCOUNTER — Encounter: Payer: Self-pay | Admitting: Internal Medicine

## 2015-12-12 ENCOUNTER — Ambulatory Visit: Payer: 59 | Admitting: Internal Medicine

## 2015-12-14 ENCOUNTER — Ambulatory Visit (INDEPENDENT_AMBULATORY_CARE_PROVIDER_SITE_OTHER): Payer: 59 | Admitting: Internal Medicine

## 2015-12-14 VITALS — BP 129/87 | HR 72 | Temp 94.5°F | Ht 72.0 in | Wt 183.5 lb

## 2015-12-14 DIAGNOSIS — G061 Intraspinal abscess and granuloma: Secondary | ICD-10-CM | POA: Diagnosis not present

## 2015-12-14 DIAGNOSIS — M4646 Discitis, unspecified, lumbar region: Secondary | ICD-10-CM | POA: Diagnosis not present

## 2015-12-14 MED ORDER — MORPHINE SULFATE ER 15 MG PO TBCR: 15.0000 mg | EXTENDED_RELEASE_TABLET | Freq: Two times a day (BID) | ORAL | Status: DC

## 2015-12-14 MED ORDER — CEFTRIAXONE SODIUM 1 G IJ SOLR: 1.0000 g | INTRAMUSCULAR | Status: DC

## 2015-12-14 MED ORDER — AMOXICILLIN 500 MG PO CAPS: 500.0000 mg | ORAL_CAPSULE | Freq: Three times a day (TID) | ORAL | Status: DC

## 2015-12-14 NOTE — Assessment & Plan Note (Signed)
He is improving slowly on therapy for streptococcal lumbar infection but he is still having severe pain that limits his ability to work. His inflammatory markers have normalized. I will change ceftriaxone to oral amoxicillin and have his PICC removed. He will follow-up with me in 4 weeks.

## 2015-12-14 NOTE — Progress Notes (Signed)
[facility]  Patient Active Problem List   Diagnosis Date Noted  . Lumbar discitis 11/13/2015    Priority: High  . 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 Left foot drop 11/13/2015    Priority: High  . Pressure ulcer 11/14/2015  . Acute osteomyelitis (HCC) 11/14/2015  . BPH (benign prostatic hyperplasia) 11/13/2015  . Urinary retention 11/13/2015  . Chronic back pain 11/13/2015    Patient's Medications  New Prescriptions   AMOXICILLIN (AMOXIL) 500 MG CAPSULE    Take 1 capsule (500 mg total) by mouth 3 (three) times daily.  Previous Medications   AMINO ACIDS-PROTEIN HYDROLYS (FEEDING SUPPLEMENT, PRO-STAT SUGAR FREE 64,) LIQD    Take 30 mLs by mouth 2 (two) times daily.   COLLAGENASE (SANTYL) OINTMENT    Apply topically daily.   CYCLOBENZAPRINE (FLEXERIL) 10 MG TABLET    Take 10 mg by mouth 3 (three) times daily as needed for muscle spasms.   FEEDING SUPPLEMENT (BOOST / RESOURCE BREEZE) LIQD    Take 1 Container by mouth 3 (three) times daily between meals.   GABAPENTIN (NEURONTIN) 300 MG CAPSULE    Take 300 mg by mouth 2 (two) times daily.   HYDROCODONE-ACETAMINOPHEN (NORCO/VICODIN) 5-325 MG TABLET    Take 1 tablet by mouth every 6 (six) hours as needed for moderate pain.   SENNA (SENOKOT) 8.6 MG TABS TABLET    Take 2 tablets (17.2 mg total) by mouth daily.   TAMSULOSIN (FLOMAX) 0.4 MG CAPS CAPSULE    Take 0.4 mg by mouth daily.  Modified Medications   Modified Medication Previous Medication   MORPHINE (MS CONTIN) 15 MG 12 HR TABLET morphine (MS CONTIN) 15 MG 12 hr tablet      Take 1 tablet (15 mg total) by mouth every 12 (twelve) hours.    Take 1 tablet (15 mg total) by mouth every 12 (twelve) hours.  Discontinued Medications   MELOXICAM (MOBIC) 15 MG TABLET    Take 15 mg by mouth daily.   NAPROXEN (NAPROSYN) 500 MG TABLET    Take 1 tablet (500 mg total) by mouth 2 (two) times daily as needed for mild pain,  moderate pain or headache (TAKE WITH MEALS.).   OXYCODONE (OXYCONTIN) 15 MG 12 HR TABLET    Take 1 tablet (15 mg total) by mouth every 12 (twelve) hours.   OXYCODONE 10 MG TABS    Take 0.5-1 tablets (5-10 mg total) by mouth every 4 (four) hours as needed for moderate pain.    Subjective: Mr. Adaline Sill is in for his hospital follow-up visit. He developed severe low back pain in December and was eventually found to have L1-2 discitis and admitted to the hospital at [facility] in early January. An aspirate grew Streptococcus mitis. He was started on IV antibiotic therapy but did not have prompt clinical improvement. Repeat MRI revealed continued discitis with a small ventral epidural abscess leading him to be transferred to [facility]. I treated him with IV ceftriaxone. He has now completed 50 days of therapy. He has had no problems tolerating his antibiotic or PICC. He is feeling better. His left-sided back pain has resolved. He still has right lower back pain that radiates around to his groin. His pain is still severe. He rates his pain at about 7 out of 10 currently. He has difficulty putting on his shoes and socks and cannot bend over  yet. He remains out of work. However, his pain is improving. He is now able to get around without a cane. He is walking more and has started riding his bike. He is requiring less pain medication than he was one month ago. He is still taking MS Contin 15 mg twice daily. Over the past week he has been requiring about 1 hydrocodone/acetaminophen 5/325 3 times daily for breakthrough pain.  Review of Systems: Review of Systems  Constitutional: Positive for malaise/fatigue. Negative for fever, chills, weight loss and diaphoresis.  HENT: Negative for sore throat.   Respiratory: Negative for cough, sputum production and shortness of breath.   Cardiovascular: Negative for chest pain.  Gastrointestinal: Negative for nausea, vomiting and diarrhea.  Genitourinary:  Negative for dysuria and frequency.  Musculoskeletal: Positive for back pain. Negative for myalgias and joint pain.  Skin: Negative for rash.  Neurological: Positive for weakness. Negative for sensory change, focal weakness and headaches.  Psychiatric/Behavioral: Negative for depression.    Past Medical History  Diagnosis Date  . Chronic back pain   . Anxiety   . Arthritis     Social History  Substance Use Topics  . Smoking status: Never Smoker   . Smokeless tobacco: Not on file  . Alcohol Use: No    Family History  Problem Relation Age of Onset  . Alzheimer's disease Father   . Parkinson's disease Father   . Diabetes Mellitus II Mother   . Deep vein thrombosis Mother     No Known Allergies  Objective: Filed Vitals:   12/14/15 0921  BP: 129/87  Pulse: 72  Temp: 94.5 F (34.7 C)  TempSrc: Oral  Height: 6' (1.829 m)  Weight: 183 lb 8 oz (83.235 kg)   Body mass index is 24.88 kg/(m^2).  Physical Exam  Constitutional: He is oriented to person, place, and time. No distress.  He looks better than when I last saw him in the hospital. He still appears somewhat uncomfortable due to his back pain.  HENT:  Mouth/Throat: No oropharyngeal exudate.  Eyes: Conjunctivae are normal.  Cardiovascular: Normal rate and regular rhythm.   No murmur heard. Pulmonary/Chest: Breath sounds normal.  Abdominal: Soft. There is no tenderness.  Musculoskeletal: Normal range of motion.  Neurological: He is alert and oriented to person, place, and time. He has normal reflexes. Gait normal.  He stands slowly. His gait is slow but normal. He has no lower extremity weakness.  Skin: No rash noted.  Right arm PICC site appears normal.  Psychiatric: Mood and affect normal.    Lab Results SED RATE (mm/hr)  Date Value  11/14/2015 47*   CRP (mg/dL)  Date Value  16/08/9603 3.3*   Sedimentation rate 12/11/2015: 8 (normal) C-reactive protein 12/11/2015: 5.7 (normal)   Problem List Items  Addressed This Visit      High   Abscess in epidural space of lumbar spine   Relevant Medications   amoxicillin (AMOXIL) 500 MG capsule   Lumbar discitis - Primary    He is improving slowly on therapy for streptococcal lumbar infection but he is still having severe pain that limits his ability to work. His inflammatory markers have normalized. I will change ceftriaxone to oral amoxicillin and have his PICC removed. He will follow-up with me in 4 weeks.      Relevant Medications   HYDROcodone-acetaminophen (NORCO/VICODIN) 5-325 MG tablet   morphine (MS CONTIN) 15 MG 12 hr tablet       Cliffton Asters, MD [facility] for  Infectious Disease [facility] [phone] pager   [phone] cell 12/14/2015, 12:22 PM

## 2015-12-15 ENCOUNTER — Telehealth: Payer: Self-pay | Admitting: *Deleted

## 2015-12-15 NOTE — Telephone Encounter (Signed)
Shanda Bumps, [facility] RN calling from the home for confirmation of order to pull PICC today.  Confirmed per Dr. Blair Dolphin office note 2/10. Andree Coss, RN

## 2016-01-23 ENCOUNTER — Encounter: Payer: Self-pay | Admitting: Internal Medicine

## 2016-01-23 ENCOUNTER — Ambulatory Visit (INDEPENDENT_AMBULATORY_CARE_PROVIDER_SITE_OTHER): Payer: 59 | Admitting: Internal Medicine

## 2016-01-23 VITALS — BP 131/90 | HR 111 | Temp 98.0°F | Wt 190.0 lb

## 2016-01-23 DIAGNOSIS — Z23 Encounter for immunization: Secondary | ICD-10-CM

## 2016-01-23 DIAGNOSIS — G061 Intraspinal abscess and granuloma: Secondary | ICD-10-CM

## 2016-01-23 NOTE — Assessment & Plan Note (Signed)
I'm hopeful that his lumbar infection has been cured. His sedimentation rate had normalized as of one month ago. He will continue off of antibiotics. I have written a note for him to release him back to work full time. He is aware that his back pain will probably worsen after he returns to work but this does not necessarily mean his infection is coming back. I will repeat his inflammatory markers today and see him back in 6 weeks.

## 2016-01-23 NOTE — Progress Notes (Signed)
[facility]  Patient Active Problem List   Diagnosis Date Noted  . Lumbar discitis 11/13/2015    Priority: High  . 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 Left foot drop 11/13/2015    Priority: High  . Pressure ulcer 11/14/2015  . Acute osteomyelitis (HCC) 11/14/2015  . BPH (benign prostatic hyperplasia) 11/13/2015  . Urinary retention 11/13/2015  . Chronic back pain 11/13/2015    Patient's Medications  New Prescriptions   No medications on file  Previous Medications   AMINO ACIDS-PROTEIN HYDROLYS (FEEDING SUPPLEMENT, PRO-STAT SUGAR FREE 64,) LIQD    Take 30 mLs by mouth 2 (two) times daily.   COLLAGENASE (SANTYL) OINTMENT    Apply topically daily.   CYCLOBENZAPRINE (FLEXERIL) 10 MG TABLET    Take 10 mg by mouth 3 (three) times daily as needed for muscle spasms.   FEEDING SUPPLEMENT (BOOST / RESOURCE BREEZE) LIQD    Take 1 Container by mouth 3 (three) times daily between meals.   GABAPENTIN (NEURONTIN) 300 MG CAPSULE    Take 300 mg by mouth 2 (two) times daily.   HYDROCODONE-ACETAMINOPHEN (NORCO/VICODIN) 5-325 MG TABLET    Take 1 tablet by mouth every 6 (six) hours as needed for moderate pain.   MORPHINE (MS CONTIN) 15 MG 12 HR TABLET    Take 1 tablet (15 mg total) by mouth every 12 (twelve) hours.   SENNA (SENOKOT) 8.6 MG TABS TABLET    Take 2 tablets (17.2 mg total) by mouth daily.   TAMSULOSIN (FLOMAX) 0.4 MG CAPS CAPSULE    Take 0.4 mg by mouth daily.  Modified Medications   No medications on file  Discontinued Medications   AMOXICILLIN (AMOXIL) 500 MG CAPSULE    Take 1 capsule (500 mg total) by mouth 3 (three) times daily.    Subjective: [name] is in for his routine follow-up visit. He completed 2-1/2 months of antibiotic therapy for Streptococcus mitis lumbar infection about 2 weeks ago. He is gradually feeling better. His right-sided low back pain is improving slowly. He has been able to increase  his activity level. He has been riding a stationary bike and lifting weights at home. He has been able to get by with less pain medication. He is not needing to take his MS Contin every day. His last dose was 4 days ago. He is taking less hydrocodone, generally only 2 daily to control his pain. He has run out of short-term disability and feels he is ready to try to get back to work.  Review of Systems: Review of Systems  Constitutional: Negative for fever, chills, weight loss, malaise/fatigue and diaphoresis.  Respiratory: Negative for cough, sputum production and shortness of breath.   Cardiovascular: Negative for chest pain.  Gastrointestinal: Negative for nausea, vomiting, abdominal pain and diarrhea.  Musculoskeletal: Positive for back pain. Negative for myalgias and joint pain.  Skin: Negative for rash.  Neurological: Negative for dizziness, focal weakness and headaches.    Past Medical History  Diagnosis Date  . Chronic back pain   . Anxiety   . Arthritis     Social History  Substance Use Topics  . Smoking status: Never Smoker   . Smokeless tobacco: None  . Alcohol Use: No    Family History  Problem Relation Age of Onset  . Alzheimer's disease Father   . Parkinson's disease Father   . Diabetes Mellitus II Mother   .  Deep vein thrombosis Mother     No Known Allergies  Objective: Filed Vitals:   01/23/16 1425  BP: 131/90  Pulse: 111  Temp: 98 F (36.7 C)  TempSrc: Oral  Weight: 190 lb (86.183 kg)   Body mass index is 25.76 kg/(m^2).  Physical Exam  Constitutional: He is oriented to person, place, and time.  He is pleasant and in no distress.  Cardiovascular: Regular rhythm.   No murmur heard. Pulmonary/Chest: Breath sounds normal.  Neurological: He is alert and oriented to person, place, and time. Gait normal.  Skin: No rash noted.  Psychiatric: Mood and affect normal.    Lab Results    Problem List Items Addressed This Visit      High   Abscess  in epidural space of lumbar spine    I'm hopeful that his lumbar infection has been cured. His sedimentation rate had normalized as of one month ago. He will continue off of antibiotics. I have written a note for him to release him back to work full time. He is aware that his back pain will probably worsen after he returns to work but this does not necessarily mean his infection is coming back. I will repeat his inflammatory markers today and see him back in 6 weeks.      Relevant Orders   C-reactive protein   Sedimentation rate    Other Visit Diagnoses    Need for prophylactic vaccination and inoculation against influenza    -  Primary    Relevant Orders    Flu Vaccine QUAD 36+ mos IM (Fluarix & Fluzone Quad PF        Cliffton Asters, MD [facility] for Infectious Disease [facility] [phone] pager   [phone] cell 01/23/2016, 2:53 PM

## 2016-01-24 LAB — C-REACTIVE PROTEIN

## 2016-01-24 LAB — SEDIMENTATION RATE: Sed Rate: 1 mm/hr (ref 0–20)

## 2016-03-21 ENCOUNTER — Ambulatory Visit (INDEPENDENT_AMBULATORY_CARE_PROVIDER_SITE_OTHER): Payer: 59 | Admitting: Internal Medicine

## 2016-03-21 ENCOUNTER — Encounter: Payer: Self-pay | Admitting: Internal Medicine

## 2016-03-21 DIAGNOSIS — M4646 Discitis, unspecified, lumbar region: Secondary | ICD-10-CM

## 2016-03-21 LAB — C-REACTIVE PROTEIN

## 2016-03-21 NOTE — Assessment & Plan Note (Signed)
Although he is still having back pain, he is requiring less pain medication and has been able to return to strenuous physical work full time. I suspect that his lumbar infection has been cured. He will remain off of antibiotics. I will repeat his inflammatory markers today. He can follow-up here as needed.

## 2016-03-21 NOTE — Progress Notes (Signed)
[facility]  Patient Active Problem List   Diagnosis Date Noted  . Lumbar discitis 11/13/2015    Priority: High  . 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 Left foot drop 11/13/2015    Priority: High  . Pressure ulcer 11/14/2015  . Acute osteomyelitis (HCC) 11/14/2015  . BPH (benign prostatic hyperplasia) 11/13/2015  . Urinary retention 11/13/2015  . Chronic back pain 11/13/2015    Patient's Medications  New Prescriptions   No medications on file  Previous Medications   CLONAZEPAM (KLONOPIN) 0.5 MG TABLET    Take 0.5 mg by mouth 4 (four) times daily as needed for anxiety.   COLLAGENASE (SANTYL) OINTMENT    Apply topically daily.   CYCLOBENZAPRINE (FLEXERIL) 10 MG TABLET    Take 10 mg by mouth 3 (three) times daily as needed for muscle spasms.   FEEDING SUPPLEMENT (BOOST / RESOURCE BREEZE) LIQD    Take 1 Container by mouth 3 (three) times daily between meals.   GABAPENTIN (NEURONTIN) 300 MG CAPSULE    Take 300 mg by mouth 2 (two) times daily.   HYDROCODONE-ACETAMINOPHEN (NORCO/VICODIN) 5-325 MG TABLET    Take 1 tablet by mouth every 6 (six) hours as needed for moderate pain.   MELOXICAM (MOBIC) 15 MG TABLET    Take 15 mg by mouth daily.   SENNA (SENOKOT) 8.6 MG TABS TABLET    Take 2 tablets (17.2 mg total) by mouth daily.   TAMSULOSIN (FLOMAX) 0.4 MG CAPS CAPSULE    Take 0.4 mg by mouth daily.  Modified Medications   No medications on file  Discontinued Medications   AMINO ACIDS-PROTEIN HYDROLYS (FEEDING SUPPLEMENT, PRO-STAT SUGAR FREE 64,) LIQD    Take 30 mLs by mouth 2 (two) times daily.   MORPHINE (MS CONTIN) 15 MG 12 HR TABLET    Take 1 tablet (15 mg total) by mouth every 12 (twelve) hours.    Subjective: [name] is in for his routine follow-up visit. He completed 2-1/2 months of antibiotic therapy for Streptococcus mitis lumbar infection on 01/09/2016. His sedimentation rate and C-reactive protein  were normal at that time. He has not had any fever, chills or sweats. He has been able to stop his morphine. He continues to take about 3 hydrocodone daily to control his back pain. 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 He often is lifting very heavy objects.  Review of Systems: Review of Systems  Constitutional: Negative for fever, chills, weight loss, malaise/fatigue and diaphoresis.  HENT: Negative for sore throat.   Respiratory: Negative for cough.   Cardiovascular: Negative for chest pain.  Gastrointestinal: Negative for nausea, vomiting and diarrhea.  Genitourinary: Negative for dysuria and frequency.       He notes that his urine is darker than normal and often has a foul odor. He states that this began when he started on antibiotics.  Musculoskeletal: Positive for back pain.  Skin: Negative for rash.  Neurological: Negative for headaches.  Psychiatric/Behavioral: Positive for depression. Negative for substance abuse. The patient is not nervous/anxious.     Past Medical History  Diagnosis Date  . Chronic back pain   . Anxiety   . Arthritis     Social History  Substance Use Topics  . Smoking status: Never Smoker   . Smokeless tobacco: None  . Alcohol Use: No    Family History  Problem Relation  Age of Onset  . Alzheimer's disease Father   . Parkinson's disease Father   . Diabetes Mellitus II Mother   . Deep vein thrombosis Mother     No Known Allergies  Objective: Filed Vitals:   03/21/16 0928  BP: 115/81  Pulse: 78  Temp: 97.9 F (36.6 C)  TempSrc: Oral  Weight: 191 lb 8 oz (86.864 kg)   Body mass index is 25.97 kg/(m^2).  Physical Exam  Constitutional: He is oriented to person, place, and time.  He is alert, calm and in no distress.  HENT:  Mouth/Throat: No oropharyngeal exudate.  Eyes: Conjunctivae are normal.  Cardiovascular: Normal rate and regular rhythm.   No murmur heard. Pulmonary/Chest: Breath sounds normal.  Musculoskeletal:  Normal range of motion.  Neurological: He is alert and oriented to person, place, and time. Gait normal.  Skin: No rash noted.  Psychiatric: Mood and affect normal.    Lab Results SED RATE (mm/hr)  Date Value  01/23/2016 1  11/14/2015 47*   CRP (mg/dL)  Date Value  27/25/3664 <0.5  11/14/2015 3.3*     Problem List Items Addressed This Visit      High   Lumbar discitis    Although he is still having back pain, he is requiring less pain medication and has been able to return to strenuous physical work full time. I suspect that his lumbar infection has been cured. He will remain off of antibiotics. I will repeat his inflammatory markers today. He can follow-up here as needed.      Relevant Medications   meloxicam (MOBIC) 15 MG tablet   Other Relevant Orders   C-reactive protein   Sedimentation rate   Urinalysis, Routine w reflex microscopic       Cliffton Asters, MD [facility] for Infectious Disease [facility] [phone] pager   [phone] cell 03/21/2016, 9:53 AM

## 2016-03-22 LAB — SEDIMENTATION RATE: Sed Rate: 1 mm/hr (ref 0–20)

## 2016-03-22 LAB — URINALYSIS, ROUTINE W REFLEX MICROSCOPIC
BILIRUBIN URINE: NEGATIVE
Glucose, UA: NEGATIVE
Hgb urine dipstick: NEGATIVE
Ketones, ur: NEGATIVE
LEUKOCYTES UA: NEGATIVE
NITRITE: NEGATIVE
PROTEIN: NEGATIVE
Specific Gravity, Urine: 1.027 (ref 1.001–1.035)
pH: 6 (ref 5.0–8.0)

## 2016-08-30 HISTORY — PX: COLONOSCOPY: SHX174

## 2019-04-23 ENCOUNTER — Inpatient Hospital Stay (HOSPITAL_COMMUNITY)
Admission: AD | Admit: 2019-04-23 | Discharge: 2019-04-26 | DRG: 208 | Disposition: A | Discharge status: Home / Self Care | Payer: Commercial Managed Care - PPO | Source: Other Acute Inpatient Hospital | Attending: Emergency Medicine | Admitting: Emergency Medicine

## 2019-04-23 ENCOUNTER — Telehealth: Payer: Self-pay | Admitting: Internal Medicine

## 2019-04-23 ENCOUNTER — Inpatient Hospital Stay (HOSPITAL_COMMUNITY): Payer: Commercial Managed Care - PPO

## 2019-04-23 DIAGNOSIS — M1990 Unspecified osteoarthritis, unspecified site: Secondary | ICD-10-CM | POA: Diagnosis present

## 2019-04-23 DIAGNOSIS — Z791 Long term (current) use of non-steroidal anti-inflammatories (NSAID): Secondary | ICD-10-CM

## 2019-04-23 DIAGNOSIS — Z820 Family history of epilepsy and other diseases of the nervous system: Secondary | ICD-10-CM | POA: Diagnosis not present

## 2019-04-23 DIAGNOSIS — Z833 Family history of diabetes mellitus: Secondary | ICD-10-CM

## 2019-04-23 DIAGNOSIS — J9600 Acute respiratory failure, unspecified whether with hypoxia or hypercapnia: Secondary | ICD-10-CM | POA: Diagnosis present

## 2019-04-23 DIAGNOSIS — F419 Anxiety disorder, unspecified: Secondary | ICD-10-CM | POA: Diagnosis present

## 2019-04-23 DIAGNOSIS — J9691 Respiratory failure, unspecified with hypoxia: Secondary | ICD-10-CM

## 2019-04-23 DIAGNOSIS — M549 Dorsalgia, unspecified: Secondary | ICD-10-CM | POA: Diagnosis present

## 2019-04-23 DIAGNOSIS — G8929 Other chronic pain: Secondary | ICD-10-CM | POA: Diagnosis present

## 2019-04-23 DIAGNOSIS — Z20828 Contact with and (suspected) exposure to other viral communicable diseases: Secondary | ICD-10-CM | POA: Diagnosis present

## 2019-04-23 DIAGNOSIS — R0602 Shortness of breath: Secondary | ICD-10-CM | POA: Diagnosis not present

## 2019-04-23 DIAGNOSIS — F1010 Alcohol abuse, uncomplicated: Secondary | ICD-10-CM | POA: Diagnosis present

## 2019-04-23 DIAGNOSIS — J690 Pneumonitis due to inhalation of food and vomit: Secondary | ICD-10-CM | POA: Diagnosis present

## 2019-04-23 DIAGNOSIS — R1310 Dysphagia, unspecified: Secondary | ICD-10-CM | POA: Diagnosis not present

## 2019-04-23 DIAGNOSIS — J9601 Acute respiratory failure with hypoxia: Secondary | ICD-10-CM | POA: Diagnosis not present

## 2019-04-23 DIAGNOSIS — R1313 Dysphagia, pharyngeal phase: Secondary | ICD-10-CM | POA: Diagnosis present

## 2019-04-23 DIAGNOSIS — T41295A Adverse effect of other general anesthetics, initial encounter: Secondary | ICD-10-CM | POA: Diagnosis not present

## 2019-04-23 DIAGNOSIS — R001 Bradycardia, unspecified: Secondary | ICD-10-CM | POA: Diagnosis not present

## 2019-04-23 DIAGNOSIS — R0600 Dyspnea, unspecified: Secondary | ICD-10-CM | POA: Diagnosis present

## 2019-04-23 LAB — CREATININE, SERUM
Creatinine, Ser: 0.95 mg/dL (ref 0.61–1.24)
GFR calc Af Amer: >60 mL/min (ref >60)
GFR calc non Af Amer: >60 mL/min (ref >60)

## 2019-04-23 LAB — CBC
HCT: 43 % (ref 39.0–52.0)
Hemoglobin: 13.7 g/dL (ref 13.0–17.0)
MCH: 29 pg (ref 26.0–34.0)
MCHC: 31.9 g/dL (ref 30.0–36.0)
MCV: 90.9 fL (ref 80.0–100.0)
Platelets: 224 10*3/uL (ref 150–400)
RBC: 4.73 MIL/uL (ref 4.22–5.81)
RDW: 13.1 % (ref 11.5–15.5)
WBC: 7.9 10*3/uL (ref 4.0–10.5)
nRBC: 0 % (ref 0.0–0.2)

## 2019-04-23 LAB — POCT I-STAT 7, (LYTES, BLD GAS, ICA,H+H)
Acid-base deficit: 1 mmol/L (ref 0.0–2.0)
Bicarbonate: 21.8 mmol/L (ref 20.0–28.0)
Calcium, Ion: 1.13 mmol/L — ABNORMAL LOW (ref 1.15–1.40)
HCT: 40 % (ref 39.0–52.0)
Hemoglobin: 13.6 g/dL (ref 13.0–17.0)
O2 Saturation: 99 %
Patient temperature: 96.6
Potassium: 3.9 mmol/L (ref 3.5–5.1)
Sodium: 140 mmol/L (ref 135–145)
TCO2: 23 mmol/L (ref 22–32)
pCO2 arterial: 29.4 mmHg — ABNORMAL LOW (ref 32.0–48.0)
pH, Arterial: 7.473 — ABNORMAL HIGH (ref 7.350–7.450)
pO2, Arterial: 131 mmHg — ABNORMAL HIGH (ref 83.0–108.0)

## 2019-04-23 LAB — GLUCOSE, CAPILLARY
Glucose-Capillary: 143 mg/dL — ABNORMAL HIGH (ref 70–99)
Glucose-Capillary: 150 mg/dL — ABNORMAL HIGH (ref 70–99)

## 2019-04-23 LAB — MRSA PCR SCREENING: MRSA by PCR: NEGATIVE

## 2019-04-23 LAB — PROCALCITONIN: Procalcitonin: <0.1 ng/mL

## 2019-04-23 MED ORDER — PANTOPRAZOLE SODIUM 40 MG PO PACK: 40.0000 mg | PACK | Freq: Every day | ORAL | Status: DC

## 2019-04-23 MED ORDER — CHLORHEXIDINE GLUCONATE 0.12% ORAL RINSE (MEDLINE KIT)
15.0000 mL | Freq: Two times a day (BID) | OROMUCOSAL | Status: DC
  Administered 2019-04-23 – 2019-04-24 (×4): 15 mL via OROMUCOSAL

## 2019-04-23 MED ORDER — PANTOPRAZOLE SODIUM 40 MG PO PACK
40.0000 mg | PACK | Freq: Two times a day (BID) | ORAL | Status: DC
  Administered 2019-04-23 – 2019-04-26 (×6): 40 mg
  Filled 2019-04-23 (×6): qty 20

## 2019-04-23 MED ORDER — SENNOSIDES 8.8 MG/5ML PO SYRP: 5.0000 mL | ORAL_SOLUTION | Freq: Two times a day (BID) | ORAL | Status: DC | PRN

## 2019-04-23 MED ORDER — SODIUM CHLORIDE 0.9 % IV SOLN
INTRAVENOUS | Status: DC
  Administered 2019-04-23: 14:00:00 via INTRAVENOUS

## 2019-04-23 MED ORDER — FENTANYL 2500MCG IN NS 250ML (10MCG/ML) PREMIX INFUSION
50.0000 ug/h | INTRAVENOUS | Status: DC
  Administered 2019-04-23: 19:00:00 200 ug/h via INTRAVENOUS
  Administered 2019-04-24: 19:00:00 100 ug/h via INTRAVENOUS
  Administered 2019-04-24: 200 ug/h via INTRAVENOUS
  Filled 2019-04-23 (×3): qty 250

## 2019-04-23 MED ORDER — DEXAMETHASONE SODIUM PHOSPHATE 10 MG/ML IJ SOLN
4.0000 mg | Freq: Two times a day (BID) | INTRAMUSCULAR | Status: DC
  Administered 2019-04-23 – 2019-04-24 (×4): 4 mg via INTRAVENOUS
  Filled 2019-04-23 (×4): qty 1

## 2019-04-23 MED ORDER — CHLORHEXIDINE GLUCONATE CLOTH 2 % EX PADS
6.0000 | MEDICATED_PAD | Freq: Every day | CUTANEOUS | Status: DC
  Administered 2019-04-23 – 2019-04-25 (×3): 6 via TOPICAL

## 2019-04-23 MED ORDER — PROPOFOL 1000 MG/100ML IV EMUL
0.0000 ug/kg/min | INTRAVENOUS | Status: DC
  Administered 2019-04-23: 15 ug/kg/min via INTRAVENOUS
  Filled 2019-04-23: qty 100

## 2019-04-23 MED ORDER — ONDANSETRON HCL 4 MG/2ML IJ SOLN
4.0000 mg | Freq: Four times a day (QID) | INTRAMUSCULAR | Status: DC | PRN
  Administered 2019-04-24: 4 mg via INTRAVENOUS
  Filled 2019-04-23: qty 2

## 2019-04-23 MED ORDER — ALBUTEROL SULFATE (2.5 MG/3ML) 0.083% IN NEBU: 2.5000 mg | INHALATION_SOLUTION | RESPIRATORY_TRACT | Status: DC | PRN

## 2019-04-23 MED ORDER — SODIUM CHLORIDE 0.9 % IV SOLN
2.0000 g | INTRAVENOUS | Status: DC
  Administered 2019-04-23 – 2019-04-24 (×2): 2 g via INTRAVENOUS
  Filled 2019-04-23 (×2): qty 20

## 2019-04-23 MED ORDER — HEPARIN SODIUM (PORCINE) 5000 UNIT/ML IJ SOLN: 5000.0000 [IU] | Freq: Three times a day (TID) | INTRAMUSCULAR | Status: DC

## 2019-04-23 MED ORDER — FENTANYL BOLUS VIA INFUSION
50.0000 ug | INTRAVENOUS | Status: DC | PRN
  Administered 2019-04-23 – 2019-04-25 (×15): 50 ug via INTRAVENOUS
  Filled 2019-04-23: qty 50

## 2019-04-23 MED ORDER — ORAL CARE MOUTH RINSE
15.0000 mL | OROMUCOSAL | Status: DC
  Administered 2019-04-23 – 2019-04-24 (×15): 15 mL via OROMUCOSAL

## 2019-04-23 MED ORDER — MIDAZOLAM 50MG/50ML (1MG/ML) PREMIX INFUSION
0.0000 mg/h | INTRAVENOUS | Status: DC
  Administered 2019-04-23: 6 mg/h via INTRAVENOUS
  Administered 2019-04-24: 02:00:00 7 mg/h via INTRAVENOUS
  Administered 2019-04-24: 15:00:00 2 mg/h via INTRAVENOUS
  Filled 2019-04-23 (×4): qty 50

## 2019-04-23 MED ORDER — ACETAMINOPHEN 325 MG PO TABS: 650.0000 mg | ORAL_TABLET | ORAL | Status: DC | PRN

## 2019-04-23 NOTE — H&P (Signed)
NAME:  Evan JosephRandy L Wolf, MRN:  [id number], DOB:  1961/06/17, LOS: 0 ADMISSION DATE:  04/23/2019, CONSULTATION DATE:  04/23/2019 REFERRING MD:  Ebbie LatusSikorski, CHIEF COMPLAINT:  Dysphasia/ Airway swelling/ respiratory Failure   Brief History   History obtained from medical record as patient is intubated and sedated. 58 year old male never smoker with history of shortness of breath and difficulty swallowing, with  throat swelling  for the last month. He aspirated on honey and strawberries on 04/23/2019 while at home ,and was emergently transported to an outside hospital via EMS where he was emergently Intubated with a #6 ETT. He had associated vomiting with suspected aspiration  pneumonitis. BP was initially soft, but patient has responded to IVF ( 3 L per report). He also had positive gastric occult test, and was started on an octreotide gtt. He is heavily sedated on Fentanyl, versed, and propofol. [facility]  History of present illness    58 year old male never smoker with history of shortness of breath and difficulty swallowing, with  throat swelling  for the last month. He aspirated on honey and strawberries on 04/23/2019 while at home ,and was emergently transported to an outside hospital via EMS where he was emergently Intubated with a #6 ETT. He had associated vomiting with suspected aspiration  pneumonitis. Initial Lactate was 3.0, BP was initially soft, but patient has responded to IVF ( 3 L per report). Last lactate was 1.6. Troponin was < 0.01. He also had positive gastric occult test, and was started on an octreotide gtt. HGB is 15.4 and platelets are 275,000. Coags are WNL.WBC is 6.6. TSH and Free T 4 are WNL. He is heavily sedated on Fentanyl, versed, and propofol.  CTA showed bilateral medial lower lobe airspace opacities, right greater than left.  Posterior right upper lobe airspace opacity, suspicious for aspiration pneumonia.  There was  no evidence of PE.  CT neck with contrast was positive for diffuse symmetric edema surrounding the larynx and the thyroid with in the deep cervical compartments.  No mass or abscess noted no lymphadenopathy. Pt. Has a history of ETOH abuse , and  oxycodone and benzos use. Urine drug screen  was + for Benzos and Marijuana. Pt.  [facility] for further management and care   by the PCCM service.   Past Medical History    Past Medical History:  Diagnosis Date  . Anxiety   . Arthritis   . Chronic back pain   ETOH abuse  Significant Hospital Events   [facility]19 ETT #6.0 6/19 Transferred to [facility]  Consults:  PCCM  Procedures:  6/19>> Intubation  Significant Diagnostic Tests:  6/19 CXR Mild bilateral hilar and mediastinal fullness cannot exclude adenopathy.  Right lung clear.  No effusions. 04/23/2019 CT neck with contrast Diffuse symmetric edema surrounding the larynx and thyroid gland with deep cervical compartments.  No discrete mass or abscess.  No lymphadenopathy. Findings of uncertain etiology may represent allergic reaction, thyroiditis, laryngitis, or submandibular sialadenitis given location of edema, or possibly sequelae of trauma, choking or severe vomiting..  04/23/2019 CTA chest with contrast Bilateral medial lower lobe airspace opacities, right greater than left.  Posterior right upper lobe airspace opacity.  Cannot exclude pneumonia, including aspiration pneumonia.  No evidence of pulmonary embolism.  Micro Data:  6/19 Blood ( [facility]) 6/19 Covid negative  Antimicrobials:  Rocephin 6/19>>  Piper/ Tazo 6/19 >> x 1 dose @ [place]  Interim history/subjective:  Received from [place] intubated and sedated with propofol, fentanyl and versed.  Also receiving octreotide gtt  Objective   Blood pressure 98/68, pulse 74, temperature (!) 96.6 F (35.9 C), temperature source Axillary, resp. rate 18, height 6'  (1.829 m), weight 84.4 kg, SpO2 99 %.    Vent Mode: PRVC FiO2 (%):  [40 %] 40 % Set Rate:  [18 bmp] 18 bmp Vt Set:  [620 mL] 620 mL PEEP:  [5 cmH20] 5 cmH20 Plateau Pressure:  [16 cmH20] 16 cmH20  No intake or output data in the 24 hours ending 04/23/19 1352 Filed Weights   04/23/19 1212  Weight: 84.4 kg    Examination: General: Sedated and intubated male , in NAD HENT: NCAT, no obvious swelling to throat, no obvious trauma to throat,No LAD, ETT, OG tube Lungs: Bilateral chest excursion, coarse with rhonchi R>L Cardiovascular: S1, S2, RRR, No RMG Abdomen: Soft, NT, ND, BS +, Body mass index is 25.24 kg/m. Extremities: Warm and dry, brisk refill, no mottling, no obvious deformities Neuro: Sedated and intubated, MAE x 4, follows no commands GU: Foley cath with clear amber  Resolved Hospital Problem list     Assessment & Plan:  Acute Respiratory Failure 2/2 Diffuse symmetric edema surrounding the larynx and thyroid gland with deep cervical compartments.  Aspiration Pneumonia Inability to protect airway Initial ABG at OSH 7.31/ 46/396/23.2/24.6 Plan Full vent support for now Wean FiO2 and PEEP for sats > 94% ABG now Decrease rate to 14 Proventil nebs ABG in am and prn Daily SBT starting 6/20 VAP bundle Send tracheal aspirate CXR in am and trend Rocephin as above Consider Decadron for airway edema Trend mag and phos  Right Upper Lobe Airspace Opacity Suspect Aspiration pneumonitis No fever/ No leukocytosis Plan Follow Cx/ Micro from both [facility] and [place] CXR Send Tracheal aspirate  Blood Cx Urine Cx Trend WBC and fever curve Send PCT ABX as above  Initial elevated Lactate Resolved with fluid resuscitation Plan Tele monitoring EKG in am MAP goal is > 65 IVF at 50 cc per hour for now  Gastri Occult + Received from OSH on Octreotide gtt Hx ETOH Plan Start protonix Stop octreotide Monitor for obvious bleeding Trend HGB Consider TF 6/20 if no  further bleeding GI consult with active bleeding  Initial Creatinine 1.1 Plan Trend BMET Monitor UO Replete electrolytes Maintain renal perfusion Avoid nephrotoxic medications  Hx. ETOH Abuse Hx. Benzos and Opiates Drug screen + for benzos and Marijuana Smart Start ETOH Interlock on car Requiring heavy sedation Plan Monitor for withdrawal Wean versed as able Up titrate propofol as tolerated to facilitate weaning versed  Continue Fentanyl CIWA monitoring May need to add ativan at some point   Nursing spoke with patients wife ( 3rd wife) She states that he lives with her part time and travels for work. She was unsure about his drug history, and states that she knew he used to drink, but didn't think he continues to drink as he has a device on his car that monitors his ETOH levels.   Best practice:  Diet: NPO/ TF Pain/Anxiety/Delirium protocol (if indicated): Fentanyl and Propofol gtt VAP protocol (if indicated): Yes DVT prophylaxis: SCD's >> patient had heme + gastric occult test GI prophylaxis: Protonix Glucose control: CBG Q 4 Mobility: BR Code Status: Full Family Communication: RN spoke with wife, updated her on transfer and patient condition Disposition: ICU  Labs  CBC: No results for input(s): WBC, NEUTROABS, HGB, HCT, MCV, PLT in the last 168 hours.  Basic Metabolic Panel: No results for input(s): NA, K, CL, CO2, GLUCOSE, BUN, CREATININE, CALCIUM, MG, PHOS in the last 168 hours. GFR: CrCl cannot be calculated (Patient's most recent lab result is older than the maximum 21 days allowed.). No results for input(s): PROCALCITON, WBC, LATICACIDVEN in the last 168 hours.  Liver Function Tests: No results for input(s): AST, ALT, ALKPHOS, BILITOT, PROT, ALBUMIN in the last 168 hours. No results for input(s): LIPASE, AMYLASE in the last 168 hours. No results for input(s): AMMONIA in the last 168 hours.  ABG No results found for: PHART, PCO2ART, PO2ART, HCO3, TCO2,  ACIDBASEDEF, O2SAT   Coagulation Profile: No results for input(s): INR, PROTIME in the last 168 hours.  Cardiac Enzymes: No results for input(s): CKTOTAL, CKMB, CKMBINDEX, TROPONINI in the last 168 hours.  HbA1C: No results found for: HGBA1C  CBG: Recent Labs  Lab 04/23/19 1214  GLUCAP 143*    Review of Systems:   Unable as patient is sedated and intubated  Past Medical History  He,  has a past medical history of Anxiety, Arthritis, and Chronic back pain.   Surgical History    Past Surgical History:  Procedure Laterality Date  . CARPAL TUNNEL RELEASE Right   . FRACTURE SURGERY Right    ankle     Social History   reports that he has never smoked. He does not have any smokeless tobacco history on file. He reports that he does not drink alcohol or use drugs.   Family History   His family history includes Alzheimer's disease in his father; Deep vein thrombosis in his mother; Diabetes Mellitus II in his mother; Parkinson's disease in his father.   Allergies No Known Allergies   Home Medications  Prior to Admission medications   Medication Sig Start Date End Date Taking? Authorizing Provider  clonazePAM (KLONOPIN) 0.5 MG tablet Take 0.5 mg by mouth 4 (four) times daily as needed for anxiety.    [provider]  collagenase (SANTYL) ointment Apply topically daily. Patient not taking: Reported on 03/21/2016 11/17/15   Rodolph Bonghompson, Daniel V, MD  cyclobenzaprine (FLEXERIL) 10 MG tablet Take 10 mg by mouth 3 (three) times daily as needed for muscle spasms.    [provider]  feeding supplement (BOOST / RESOURCE BREEZE) LIQD Take 1 Container by mouth 3 (three) times daily between meals. Patient not taking: Reported on 03/21/2016 11/17/15   Rodolph Bonghompson, Daniel V, MD  gabapentin (NEURONTIN) 300 MG capsule Take 300 mg by mouth 2 (two) times daily.    [provider]  HYDROcodone-acetaminophen (NORCO/VICODIN) 5-325 MG tablet Take 1 tablet by mouth every 6 (six)  hours as needed for moderate pain.    [provider]  meloxicam (MOBIC) 15 MG tablet Take 15 mg by mouth daily.    [provider]  senna (SENOKOT) 8.6 MG TABS tablet Take 2 tablets (17.2 mg total) by mouth daily. 11/17/15   Rodolph Bonghompson, Daniel V, MD  tamsulosin (FLOMAX) 0.4 MG CAPS capsule Take 0.4 mg by mouth daily.    [provider]     Critical care APP  time: 2860 minutes     Bevelyn NgoSarah F. Jazira Maloney, AGACNP-BC [facility] Pulmonary/Critical Care Medicine Pager # [phone] After 4 pm please call [phone] 04/23/2019 1:52 PM

## 2019-04-23 NOTE — Telephone Encounter (Signed)
EDP at [place]   - accepted by emd last night  - current 9:56 AM 04/23/2019 - befor patient able to go to [facility] 66m07  S: throat discomfort. CT soft tissue  - neck - swelling around thryoid . Intubated with sie 6 ET tube.  Imging shows pneumonitis and aspiration. Hx of heavy etoh use +++  COVID - negative  Hgb 15.4 -> 133.9    A/P -  ETOH withdrawal Associated vomit, pneumonitis    - SBP 70s - Rx fluids (3L so far) and responded. Not on pressors but right now it is  103/61  - on vent  - gastric occult + - > octreotide, ppi  - sedation - fent 257mcg gtt, versed 10mg  gtt -> gets agitated othewrwise    Ok for tx for 47m07 - ETA < 1h     SIGNATURE    Dr. Brand Males, M.D., F.C.C.P,  Pulmonary and Critical Care Medicine Staff Physician, [place] Director - Interstitial Lung Disease  Program  Pulmonary [place] at [place], [place], [address]  Pager: [phone], If no answer or between  15:00h - 7:00h: call [phone] Telephone: [phone]  10:00 AM 04/23/2019

## 2019-04-24 ENCOUNTER — Inpatient Hospital Stay (HOSPITAL_COMMUNITY): Payer: Commercial Managed Care - PPO

## 2019-04-24 DIAGNOSIS — J9600 Acute respiratory failure, unspecified whether with hypoxia or hypercapnia: Principal | ICD-10-CM

## 2019-04-24 DIAGNOSIS — R0602 Shortness of breath: Secondary | ICD-10-CM

## 2019-04-24 LAB — POCT I-STAT 7, (LYTES, BLD GAS, ICA,H+H)
Acid-base deficit: 1 mmol/L (ref 0.0–2.0)
Bicarbonate: 22.8 mmol/L (ref 20.0–28.0)
Calcium, Ion: 1.22 mmol/L (ref 1.15–1.40)
HCT: 36 % — ABNORMAL LOW (ref 39.0–52.0)
Hemoglobin: 12.2 g/dL — ABNORMAL LOW (ref 13.0–17.0)
O2 Saturation: 99 %
Patient temperature: 36.6
Potassium: 3.8 mmol/L (ref 3.5–5.1)
Sodium: 141 mmol/L (ref 135–145)
TCO2: 24 mmol/L (ref 22–32)
pCO2 arterial: 33.8 mmHg (ref 32.0–48.0)
pH, Arterial: 7.436 (ref 7.350–7.450)
pO2, Arterial: 147 mmHg — ABNORMAL HIGH (ref 83.0–108.0)

## 2019-04-24 LAB — COMPREHENSIVE METABOLIC PANEL
ALT: 18 U/L (ref 0–44)
AST: 17 U/L (ref 15–41)
Albumin: 3.1 g/dL — ABNORMAL LOW (ref 3.5–5.0)
Alkaline Phosphatase: 43 U/L (ref 38–126)
Anion gap: 8 (ref 5–15)
BUN: 18 mg/dL (ref 6–20)
CO2: 21 mmol/L — ABNORMAL LOW (ref 22–32)
Calcium: 8.2 mg/dL — ABNORMAL LOW (ref 8.9–10.3)
Chloride: 111 mmol/L (ref 98–111)
Creatinine, Ser: 0.92 mg/dL (ref 0.61–1.24)
GFR calc Af Amer: >60 mL/min (ref >60)
GFR calc non Af Amer: >60 mL/min (ref >60)
Glucose, Bld: 138 mg/dL — ABNORMAL HIGH (ref 70–99)
Potassium: 3.9 mmol/L (ref 3.5–5.1)
Sodium: 140 mmol/L (ref 135–145)
Total Bilirubin: 1.5 mg/dL — ABNORMAL HIGH (ref 0.3–1.2)
Total Protein: 5.3 g/dL — ABNORMAL LOW (ref 6.5–8.1)

## 2019-04-24 LAB — CBC
HCT: 38.5 % — ABNORMAL LOW (ref 39.0–52.0)
Hemoglobin: 12.5 g/dL — ABNORMAL LOW (ref 13.0–17.0)
MCH: 29.2 pg (ref 26.0–34.0)
MCHC: 32.5 g/dL (ref 30.0–36.0)
MCV: 90 fL (ref 80.0–100.0)
Platelets: 197 10*3/uL (ref 150–400)
RBC: 4.28 MIL/uL (ref 4.22–5.81)
RDW: 13.6 % (ref 11.5–15.5)
WBC: 9.5 10*3/uL (ref 4.0–10.5)
nRBC: 0 % (ref 0.0–0.2)

## 2019-04-24 LAB — URINE CULTURE: Culture: NO GROWTH

## 2019-04-24 LAB — PHOSPHORUS: Phosphorus: 3.1 mg/dL (ref 2.5–4.6)

## 2019-04-24 LAB — GLUCOSE, CAPILLARY
Glucose-Capillary: 102 mg/dL — ABNORMAL HIGH (ref 70–99)
Glucose-Capillary: 113 mg/dL — ABNORMAL HIGH (ref 70–99)
Glucose-Capillary: 118 mg/dL — ABNORMAL HIGH (ref 70–99)
Glucose-Capillary: 118 mg/dL — ABNORMAL HIGH (ref 70–99)

## 2019-04-24 LAB — PROCALCITONIN: Procalcitonin: <0.1 ng/mL

## 2019-04-24 LAB — TRIGLYCERIDES: Triglycerides: 92 mg/dL (ref <150)

## 2019-04-24 LAB — MAGNESIUM: Magnesium: 2.1 mg/dL (ref 1.7–2.4)

## 2019-04-24 LAB — HIV ANTIBODY (ROUTINE TESTING W REFLEX): HIV Screen 4th Generation wRfx: NONREACTIVE

## 2019-04-24 MED ORDER — VITAL AF 1.2 CAL PO LIQD
1000.0000 mL | ORAL | Status: DC
  Administered 2019-04-24: 1000 mL

## 2019-04-24 MED ORDER — VITAL HIGH PROTEIN PO LIQD: 1000.0000 mL | ORAL | Status: DC

## 2019-04-24 MED ORDER — ZOLPIDEM TARTRATE 5 MG PO TABS
5.0000 mg | ORAL_TABLET | Freq: Once | ORAL | Status: AC
  Administered 2019-04-24: 23:00:00 5 mg
  Filled 2019-04-24: qty 1

## 2019-04-24 MED ORDER — PRO-STAT SUGAR FREE PO LIQD
30.0000 mL | Freq: Every day | ORAL | Status: DC
  Administered 2019-04-24: 30 mL
  Filled 2019-04-24: qty 30

## 2019-04-24 NOTE — Progress Notes (Signed)
eLink Physician-Brief Progress Note Patient Name: Evan Wolf DOB: 09-Jun-1961 MRN: [id number]   Date of Service  04/24/2019  HPI/Events of Note  Agitation - Request to renew bilateral soft wrist restraint orders.   eICU Interventions  Will renew bilateral soft wrist restraints X 12 hours. Day rounding team to reassess in AM.     Intervention Category Major Interventions: Delirium, psychosis, severe agitation - evaluation and management  Sommer,Steven Cornelia Copa 04/24/2019, 7:39 PM

## 2019-04-24 NOTE — Progress Notes (Signed)
NAME:  Adolm JosephRandy L Uresti, MRN:  [id number], DOB:  [birth year], LOS: 1 ADMISSION DATE:  04/23/2019, CONSULTATION DATE:  04/23/2019 REFERRING MD:  Ebbie LatusSikorski, CHIEF COMPLAINT:  Dysphasia/ Airway swelling/ respiratory Failure   Brief History   History obtained from medical record as patient is intubated and sedated. 58 year old male never smoker with history of shortness of breath and difficulty swallowing, with  throat swelling  for the last month. He aspirated on honey and strawberries on 04/23/2019 while at home ,and was emergently transported to an outside hospital via EMS where he was emergently Intubated with a #6 ETT. He had associated vomiting with suspected aspiration  pneumonitis. BP was initially soft, but patient has responded to IVF ( 3 L per report). He also had positive gastric occult test, and was started on an octreotide gtt. He is heavily sedated on Fentanyl, versed, and propofol. [facility]  [facility].  History of present illness    58 year old male never smoker with history of shortness of breath and difficulty swallowing, with  throat swelling  for the last month. He aspirated on honey and strawberries on 04/23/2019 while at home ,and was emergently transported to an outside hospital via EMS where he was emergently Intubated with a #6 ETT. He had associated vomiting with suspected aspiration  pneumonitis. Initial Lactate was 3.0, BP was initially soft, but patient has responded to IVF ( 3 L per report). Last lactate was 1.6. Troponin was < 0.01. He also had positive gastric occult test, and was started on an octreotide gtt. HGB is 15.4 and platelets are 275,000. Coags are WNL.WBC is 6.6. TSH and Free T 4 are WNL. He is heavily sedated on Fentanyl, versed, and propofol.  CTA showed bilateral medial lower lobe airspace opacities, right greater than left.  Posterior right upper lobe airspace opacity, suspicious for aspiration pneumonia.  There was  no evidence of PE.  CT neck with contrast was positive for diffuse symmetric edema surrounding the larynx and the thyroid with in the deep cervical compartments.  No mass or abscess noted no lymphadenopathy. Pt. Has a history of ETOH abuse , and  oxycodone and benzos use. Urine drug screen  was + for Benzos and Marijuana. Pt.  [facility] for further management and care   by the PCCM service.   Past Medical History    Past Medical History:  Diagnosis Date   Anxiety    Arthritis    Chronic back pain   ETOH abuse  Significant Hospital Events   [facility]19 ETT #6.0 6/19 Transferred to [facility]  Consults:  PCCM  Procedures:  6/19>> Intubation  Significant Diagnostic Tests:  6/19 CXR Mild bilateral hilar and mediastinal fullness cannot exclude adenopathy.  Right lung clear.  No effusions. 04/23/2019 CT neck with contrast Diffuse symmetric edema surrounding the larynx and thyroid gland with deep cervical compartments.  No discrete mass or abscess.  No lymphadenopathy. Findings of uncertain etiology may represent allergic reaction, thyroiditis, laryngitis, or submandibular sialadenitis given location of edema, or possibly sequelae of trauma, choking or severe vomiting..  04/23/2019 CTA chest with contrast Bilateral medial lower lobe airspace opacities, right greater than left.  Posterior right upper lobe airspace opacity.  Cannot exclude pneumonia, including aspiration pneumonia.  No evidence of pulmonary embolism.  Micro Data:  6/19 Blood ( [facility]) 6/19 Covid negative  Antimicrobials:  Rocephin 6/19>>  Piper/ Tazo 6/19 >> x 1 dose @ [place]  Interim history/subjective:  Was bradycardic overnight on propofol, changed to Versed, currently on high-dose and completely unresponsive Neck CT as above Chest x-ray today reviewed, no overt infiltrate noted   Objective   Blood pressure (!) 91/55, pulse (!) 51, temperature  98.1 F (36.7 C), resp. rate 14, height 6' (1.829 m), weight 86.8 kg, SpO2 98 %.    Vent Mode: PRVC FiO2 (%):  [30 %-40 %] 30 % Set Rate:  [14 bmp-18 bmp] 14 bmp Vt Set:  [620 mL] 620 mL PEEP:  [5 cmH20] 5 cmH20 Plateau Pressure:  [16 cmH20] 16 cmH20   Intake/Output Summary (Last 24 hours) at 04/24/2019 0954 Last data filed at 04/24/2019 0900 Gross per 24 hour  Intake 1740.32 ml  Output 1625 ml  Net 115.32 ml   Filed Weights   04/23/19 1212 04/24/19 0434  Weight: 84.4 kg 86.8 kg    Examination: General: Ill-appearing man, intubated and sedated HENT: Normocephalic, ET tube and OG tube in place, no obvious external evidence for neck or throat swelling.  No apparent trauma Lungs: Coarse bilaterally but no wheezing, no crackles Cardiovascular: Regular, bradycardic in the 50s, no murmur Abdomen: Soft, obese, nondistended, positive bowel sounds, Body mass index is 25.95 kg/m. Extremities: No deformities, no apparent edema Neuro: Fully sedated, does not respond to voice or pain on current Versed and fentanyl GU: Foley catheter in place  Resolved Hospital Problem list   Lactic acidosis  Assessment & Plan:  Acute Respiratory Failure 2/2 Diffuse symmetric edema surrounding the larynx and thyroid gland with deep cervical compartments.  Etiology unclear.  No evidence of abscess Aspiration Pneumonia Inability to protect airway Plan PRVC 8 cc/kg Continue to wean PEEP, FiO2 as able Okay to initiate PSV but both mental status and his upper airway swelling will limit ability for extubation Continue steroids for his upper airway posterior pharyngeal edema.  Question whether he will require ENT evaluation.  May need to reimage prior to any planned extubation, consider extubation in the operating room with tracheostomy at the ready Empiric antibiotics for possible aspiration pneumonia Chest x-ray for clearance pneumonitis  Right Upper Lobe Airspace Opacity Suspect Aspiration  pneumonitis No fever/ No leukocytosis Plan Ceftriaxone as above Follow culture data, clinical status.  Low threshold to discontinue ceftriaxone if infiltrates clear, no evidence of active infection  Gastri Occult + Received from OSH on Octreotide gtt Hx ETOH Plan Continue Protonix for now Follow CBC, for any evidence of active bleeding Should be okay to start tube feeding 6/20 GI consultation if any evidence of active bleeding  Hx. ETOH Abuse (Smart Start ETOH Interlock on car) Hx. Benzos and Opiates Drug screen + for benzos and Marijuana Agitation Plan Bradycardic while on propofol, changed to Versed and now heavily sedated.  Try to wean Versed and fentanyl as able.  Could consider Precedex Fentanyl infusion May need to add enteral benzos to facilitate weaning drips   Best practice:  Diet: NPO/ TF Pain/Anxiety/Delirium protocol (if indicated): Fentanyl and Versed infusions VAP protocol (if indicated): Yes DVT prophylaxis: SCD's >> patient had heme + gastric occult test GI prophylaxis: Protonix Glucose control: CBG Q 4 Mobility: BR Code Status: Full Family Communication:  Disposition: ICU  Labs   CBC: Recent Labs  Lab 04/23/19 1349 04/23/19 1440 04/24/19 0405 04/24/19 0731  WBC  --  7.9 9.5  --   HGB 13.6 13.7 12.5* 12.2*  HCT 40.0 43.0 38.5* 36.0*  MCV  --  90.9 90.0  --   PLT  --  224 197  --     Basic Metabolic Panel: Recent Labs  Lab 04/23/19 1348 04/23/19 1349 04/24/19 0405 04/24/19 0731  NA  --  140 140 141  K  --  3.9 3.9 3.8  CL  --   --  111  --   CO2  --   --  21*  --   GLUCOSE  --   --  138*  --   BUN  --   --  18  --   CREATININE 0.95  --  0.92  --   CALCIUM  --   --  8.2*  --   MG  --   --  2.1  --   PHOS  --   --  3.1  --    GFR: Estimated Creatinine Clearance: 97.2 mL/min (by C-G formula based on SCr of 0.92 mg/dL). Recent Labs  Lab 04/23/19 1348 04/23/19 1440 04/24/19 0405  PROCALCITON <0.10  --  <0.10  WBC  --  7.9 9.5     Liver Function Tests: Recent Labs  Lab 04/24/19 0405  AST 17  ALT 18  ALKPHOS 43  BILITOT 1.5*  PROT 5.3*  ALBUMIN 3.1*   No results for input(s): LIPASE, AMYLASE in the last 168 hours. No results for input(s): AMMONIA in the last 168 hours.  ABG    Component Value Date/Time   PHART 7.436 04/24/2019 0731   PCO2ART 33.8 04/24/2019 0731   PO2ART 147.0 (H) 04/24/2019 0731   HCO3 22.8 04/24/2019 0731   TCO2 24 04/24/2019 0731   ACIDBASEDEF 1.0 04/24/2019 0731   O2SAT 99.0 04/24/2019 0731     Coagulation Profile: No results for input(s): INR, PROTIME in the last 168 hours.  Cardiac Enzymes: No results for input(s): CKTOTAL, CKMB, CKMBINDEX, TROPONINI in the last 168 hours.  HbA1C: No results found for: HGBA1C  CBG: Recent Labs  Lab 04/23/19 1214 04/23/19 1512  GLUCAP 143* 150*    Critical care time: 32 minutes     Levy Pupaobert Kaylia Winborne, MD, PhD 04/24/2019, 10:06 AM [facility] [phone] or if no answer [phone]

## 2019-04-24 NOTE — Progress Notes (Signed)
Pt HR sustaining in the 40's despite decrease in sedative medications. Blood pressures have been soft but wnl. Current HR 46, BP 110/66. Pt remains otherwise asymptomatic. Dr. Lamonte Sakai notified of trend and RN instructed to continue to decrease versed gtt, monitor HR, and call if BP becomes unstable.

## 2019-04-24 NOTE — Progress Notes (Signed)
ABG Values: pH 7.44 pO2 34 pCO2 147 HCO3- 22.8  Obtained on: PRVC RR 14 VT 620 FIO2 30% P-5  Patient's FIO2 decreased to 30% per ABG

## 2019-04-24 NOTE — Progress Notes (Signed)
Contacted Dr. Emmit Alexanders due to sustained bradycardia in 40s despite down-titration of Propofol.   Propofol infusion discontinued and stopped at 0009 am. Will continue to monitor and titrate Fentanyl and Versed as necessary to maintain RASS 0 to -1

## 2019-04-24 NOTE — Progress Notes (Signed)
eLink Physician-Brief Progress Note Patient Name: Evan Wolf DOB: 05/23/1961 MRN: [id number]   Date of Service  04/24/2019  HPI/Events of Note  Patient requesting home Ambien.   eICU Interventions  Will order: 1. Ambien 5 mg per tube now.      Intervention Category Major Interventions: Other:  Sommer,Steven Cornelia Copa 04/24/2019, 10:26 PM

## 2019-04-24 NOTE — Progress Notes (Signed)
eLink Physician-Brief Progress Note Patient Name: Evan Wolf DOB: [birth year] MRN: [id number]   Date of Service  04/24/2019  HPI/Events of Note  Bradycardia - HR decreased down into 40's. Likely d/t Propofol IV infusion.   eICU Interventions  Will order: 1. D/C Propofol IV infusion. 2. Titrate Fentanyl and/or Versed IV infusion to RASS = 0 to -1.      Intervention Category Major Interventions: Arrhythmia - evaluation and management  Evan Wolf [name] 04/24/2019, 12:07 AM

## 2019-04-24 NOTE — Progress Notes (Signed)
Initial Nutrition Assessment  DOCUMENTATION CODES:  Not applicable  INTERVENTION:  Initiate TF via Vital AF 1.2 at goal rate of 60 ml/h (1440 ml per day) and Prostat 30 ml Q24 hrs to provide 1828 kcals, 123 gm protein, 1168 ml free water daily.  NUTRITION DIAGNOSIS:  Inadequate oral intake related to inability to eat as evidenced by NPO status.  GOAL:  Patient will meet greater than or equal to 90% of their needs  MONITOR:  Labs, Vent status, I & O's, Diet advancement, TF tolerance, PO intake, Weight trends, Skin  REASON FOR ASSESSMENT:  Consult Enteral/tube feeding initiation and management  ASSESSMENT:  58 y/o male pmhx anxiety, etoh abuse. Presented to OSH after aspirating on strawberries and honey at home. Suffered episode of vomiting w/ suspected aspiration PNA. Also w/ +gastric occult test. Intubated, Started on octreotide drip and transferred to [facility].   RD operating remotely d/t covid precautions. Pt intubated/sedated. All info from chart.   Minimal preexisting chart history on patient, majority of information from 2017.  He has one lone outpatient encounter in May for skin ulcer, where his wt is listed as 186, though this wt is suspicious for being reported, as it was the same weight given initially this admission. His current wt is 191.4 lbs  Propofol d/cd last night d/t bradycardia.   Patient is currently intubated on ventilator support MV: 8.4 L/min Temp (24hrs), Avg:97.6 F (36.4 C), Min:96.6 F (35.9 C), Max:98.1 F (36.7 C) Propofol: D/Cd BP: 90/56 (67)  Labs: Albumin: 3.1, Recent bg: 138-150 Meds: Decadron, PPI,  Sedation/analgesia: Fentanyl, Versed Other infusions: IVF, IV abx,   Recent Labs  Lab 04/23/19 1348 04/23/19 1349 04/24/19 0405 04/24/19 0731  NA  --  140 140 141  K  --  3.9 3.9 3.8  CL  --   --  111  --   CO2  --   --  21*  --   BUN  --   --  18  --   CREATININE 0.95  --  0.92  --   CALCIUM  --   --  8.2*  --   MG  --   --  2.1  --    PHOS  --   --  3.1  --   GLUCOSE  --   --  138*  --    NUTRITION - FOCUSED PHYSICAL EXAM: Unable to conduct  Diet Order:   Diet Order    None     EDUCATION NEEDS:  No education needs have been identified at this time  Skin:  Skin Assessment: Reviewed RN Assessment  Last BM:  Unknown- PTA  Height:  Ht Readings from Last 1 Encounters:  04/23/19 6' (1.829 m)   Weight:  Wt Readings from Last 1 Encounters:  04/24/19 86.8 kg   Wt Readings from Last 10 Encounters:  04/24/19 86.8 kg  03/21/16 86.9 kg  01/23/16 86.2 kg  12/14/15 83.2 kg  11/16/15 72.4 kg  02/21/15 83.9 kg   Ideal Body Weight:  80.91 kg  BMI:  Body mass index is 25.95 kg/m.  Estimated Nutritional Needs:  Kcal:  1840 kcals (psu 2003b) Protein:  122-139g Pro (1.4-1.6g/kg bw) Fluid:  Deferred to MD  Burtis Junes RD, LDN, CNSC Clinical Nutrition Available Tues-Sat via Pager: [phone] 04/24/2019 10:39 AM

## 2019-04-24 NOTE — Progress Notes (Signed)
The patient was intentionally biting on his ETT. A bite block was placed at this time.

## 2019-04-25 ENCOUNTER — Inpatient Hospital Stay (HOSPITAL_COMMUNITY): Payer: Commercial Managed Care - PPO

## 2019-04-25 LAB — BASIC METABOLIC PANEL
Anion gap: 6 (ref 5–15)
BUN: 29 mg/dL — ABNORMAL HIGH (ref 6–20)
CO2: 27 mmol/L (ref 22–32)
Calcium: 8.5 mg/dL — ABNORMAL LOW (ref 8.9–10.3)
Chloride: 108 mmol/L (ref 98–111)
Creatinine, Ser: 1.02 mg/dL (ref 0.61–1.24)
GFR calc Af Amer: >60 mL/min (ref >60)
GFR calc non Af Amer: >60 mL/min (ref >60)
Glucose, Bld: 125 mg/dL — ABNORMAL HIGH (ref 70–99)
Potassium: 4.4 mmol/L (ref 3.5–5.1)
Sodium: 141 mmol/L (ref 135–145)

## 2019-04-25 LAB — CBC
HCT: 38.5 % — ABNORMAL LOW (ref 39.0–52.0)
Hemoglobin: 12.3 g/dL — ABNORMAL LOW (ref 13.0–17.0)
MCH: 29.6 pg (ref 26.0–34.0)
MCHC: 31.9 g/dL (ref 30.0–36.0)
MCV: 92.8 fL (ref 80.0–100.0)
Platelets: 181 10*3/uL (ref 150–400)
RBC: 4.15 MIL/uL — ABNORMAL LOW (ref 4.22–5.81)
RDW: 13.7 % (ref 11.5–15.5)
WBC: 12.5 10*3/uL — ABNORMAL HIGH (ref 4.0–10.5)
nRBC: 0 % (ref 0.0–0.2)

## 2019-04-25 LAB — GLUCOSE, CAPILLARY
Glucose-Capillary: 131 mg/dL — ABNORMAL HIGH (ref 70–99)
Glucose-Capillary: 105 mg/dL — ABNORMAL HIGH (ref 70–99)
Glucose-Capillary: 106 mg/dL — ABNORMAL HIGH (ref 70–99)

## 2019-04-25 LAB — MAGNESIUM: Magnesium: 2.3 mg/dL (ref 1.7–2.4)

## 2019-04-25 LAB — CULTURE, RESPIRATORY W GRAM STAIN: Culture: NORMAL

## 2019-04-25 MED ORDER — DEXAMETHASONE 4 MG PO TABS
2.0000 mg | ORAL_TABLET | Freq: Four times a day (QID) | ORAL | Status: AC
  Administered 2019-04-25 (×2): 2 mg via ORAL
  Filled 2019-04-25 (×2): qty 1

## 2019-04-25 MED ORDER — GABAPENTIN 300 MG PO CAPS
300.0000 mg | ORAL_CAPSULE | Freq: Two times a day (BID) | ORAL | Status: DC
  Administered 2019-04-25: 12:00:00 300 mg via ORAL
  Filled 2019-04-25 (×2): qty 1

## 2019-04-25 MED ORDER — CYCLOBENZAPRINE HCL 10 MG PO TABS: 5.0000 mg | ORAL_TABLET | Freq: Three times a day (TID) | ORAL | Status: DC | PRN

## 2019-04-25 MED ORDER — CLONAZEPAM 0.5 MG PO TABS
0.5000 mg | ORAL_TABLET | Freq: Four times a day (QID) | ORAL | Status: DC
  Administered 2019-04-25 – 2019-04-26 (×5): 0.5 mg via ORAL
  Filled 2019-04-25 (×5): qty 1

## 2019-04-25 MED ORDER — ORAL CARE MOUTH RINSE
15.0000 mL | Freq: Two times a day (BID) | OROMUCOSAL | Status: DC
  Administered 2019-04-25 – 2019-04-26 (×3): 15 mL via OROMUCOSAL

## 2019-04-25 MED ORDER — HYDROCODONE-ACETAMINOPHEN 5-325 MG PO TABS
1.0000 | ORAL_TABLET | Freq: Four times a day (QID) | ORAL | Status: DC | PRN
  Administered 2019-04-25: 17:00:00 1 via ORAL
  Filled 2019-04-25: qty 1

## 2019-04-25 MED ORDER — LORAZEPAM 2 MG/ML IJ SOLN: 0.5000 mg | Freq: Four times a day (QID) | INTRAMUSCULAR | Status: DC | PRN

## 2019-04-25 MED ORDER — LORAZEPAM 2 MG/ML IJ SOLN: 1.0000 mg | Freq: Four times a day (QID) | INTRAMUSCULAR | Status: DC | PRN

## 2019-04-25 MED ORDER — MELOXICAM 7.5 MG PO TABS
15.0000 mg | ORAL_TABLET | Freq: Every day | ORAL | Status: DC
  Administered 2019-04-25 – 2019-04-26 (×2): 15 mg via ORAL
  Filled 2019-04-25 (×2): qty 2

## 2019-04-25 MED ORDER — CHLORHEXIDINE GLUCONATE CLOTH 2 % EX PADS
6.0000 | MEDICATED_PAD | Freq: Every day | CUTANEOUS | Status: DC
  Administered 2019-04-25: 21:00:00 6 via TOPICAL

## 2019-04-25 MED ORDER — SERTRALINE HCL 100 MG PO TABS
100.0000 mg | ORAL_TABLET | Freq: Every day | ORAL | Status: DC
  Administered 2019-04-25 – 2019-04-26 (×2): 100 mg via ORAL
  Filled 2019-04-25 (×2): qty 1

## 2019-04-25 MED ORDER — CLONAZEPAM 0.5 MG PO TABS
0.5000 mg | ORAL_TABLET | Freq: Two times a day (BID) | ORAL | Status: DC
  Administered 2019-04-25: 12:00:00 0.5 mg via ORAL
  Filled 2019-04-25: qty 1

## 2019-04-25 NOTE — Progress Notes (Addendum)
NAME:  Evan Wolf, MRN:  [id number], DOB:  20-May-1961, LOS: 2 ADMISSION DATE:  04/23/2019, CONSULTATION DATE:  04/23/2019 REFERRING MD:  Ebbie LatusSikorski, CHIEF COMPLAINT:  Dysphasia/ Airway swelling/ respiratory Failure   Brief History   History obtained from medical record as patient is intubated and sedated. 58 year old male never smoker with history of shortness of breath and difficulty swallowing, with  throat swelling  for the last month. He aspirated on honey and strawberries on 04/23/2019 while at home ,and was emergently transported to an outside hospital via EMS where he was emergently Intubated with a #6 ETT. He had associated vomiting with suspected aspiration  pneumonitis. BP was initially soft, but patient has responded to IVF ( 3 L per report). He also had positive gastric occult test, and was started on an octreotide gtt. He is heavily sedated on Fentanyl, versed, and propofol. [facility] care  [facility].  History of present illness    58 year old male never smoker with history of shortness of breath and difficulty swallowing, with  throat swelling  for the last month. He aspirated on honey and strawberries on 04/23/2019 while at home ,and was emergently transported to an outside hospital via EMS where he was emergently Intubated with a #6 ETT. He had associated vomiting with suspected aspiration  pneumonitis. Initial Lactate was 3.0, BP was initially soft, but patient has responded to IVF ( 3 L per report). Last lactate was 1.6. Troponin was < 0.01. He also had positive gastric occult test, and was started on an octreotide gtt. HGB is 15.4 and platelets are 275,000. Coags are WNL.WBC is 6.6. TSH and Free T 4 are WNL. He is heavily sedated on Fentanyl, versed, and propofol.  CTA showed bilateral medial lower lobe airspace opacities, right greater than left.  Posterior right upper lobe airspace opacity, suspicious for aspiration pneumonia.  There was  no evidence of PE.  CT neck with contrast was positive for diffuse symmetric edema surrounding the larynx and the thyroid with in the deep cervical compartments.  No mass or abscess noted no lymphadenopathy. Pt. Has a history of ETOH abuse , and  oxycodone and benzos use. Urine drug screen  was + for Benzos and Marijuana. Pt.  [facility] for further management and care   by the PCCM service.   Past Medical History    Past Medical History:  Diagnosis Date   Anxiety    Arthritis    Chronic back pain   ETOH abuse  Significant Hospital Events   04/23/2019 [facility]mission to [facility] 6/19 ETT #6.0 6/19 Transferred to [facility]  Consults:  PCCM  Procedures:  6/19>> Intubation  Significant Diagnostic Tests:  6/19 CXR Mild bilateral hilar and mediastinal fullness cannot exclude adenopathy.  Right lung clear.  No effusions. 04/23/2019 CT neck with contrast Diffuse symmetric edema surrounding the larynx and thyroid gland with deep cervical compartments.  No discrete mass or abscess.  No lymphadenopathy. Findings of uncertain etiology may represent allergic reaction, thyroiditis, laryngitis, or submandibular sialadenitis given location of edema, or possibly sequelae of trauma, choking or severe vomiting..  04/23/2019 CTA chest with contrast Bilateral medial lower lobe airspace opacities, right greater than left.  Posterior right upper lobe airspace opacity.  Cannot exclude pneumonia, including aspiration pneumonia.  No evidence of pulmonary embolism.  Micro Data:  6/19 Blood ( [facility]) 6/19 Covid negative  Antimicrobials:  Rocephin 6/19>>  6/21 [place]/ Tazo 6/19 >> x 1 dose @ [place]  Interim history/subjective:  Started to wake up on versed + fentanyl, successfully extubated overnight Sore throat, a lot of confusion and amnesia about the events that led to hospitalization    Objective   Blood pressure 105/68, pulse (!) 49, temperature (!) 97.2 F  (36.2 C), resp. rate 12, height 6' (1.829 m), weight 84.7 kg, SpO2 90 %.    Vent Mode: PRVC FiO2 (%):  [30 %] 30 % Set Rate:  [14 bmp] 14 bmp Vt Set:  [520 mL-620 mL] 520 mL PEEP:  [5 cmH20] 5 cmH20 Plateau Pressure:  [10 cmH20-39 cmH20] 10 cmH20   Intake/Output Summary (Last 24 hours) at 04/25/2019 0946 Last data filed at 04/25/2019 0800 Gross per 24 hour  Intake 947 ml  Output 1635 ml  Net -688 ml   Filed Weights   04/23/19 1212 04/24/19 0434 04/25/19 0336  Weight: 84.4 kg 86.8 kg 84.7 kg    Examination: General: Ill-appearing man, intubated and sedated HENT: Normocephalic, ET tube and OG tube in place, no obvious external evidence for neck or throat swelling.  No apparent trauma Lungs: Coarse bilaterally but no wheezing, no crackles Cardiovascular: Regular, bradycardic in the 50s, no murmur Abdomen: Soft, obese, nondistended, positive bowel sounds, Body mass index is 25.33 kg/m. Extremities: No deformities, no apparent edema Neuro: Fully sedated, does not respond to voice or pain on current Versed and fentanyl GU: Foley catheter in place  Resolved Hospital Problem list   Lactic acidosis Acute respiratory failure, VDRF  Assessment & Plan:  Acute Respiratory Failure 2/2 Diffuse symmetric edema surrounding the larynx and thyroid gland with deep cervical compartments.  Etiology unclear.  No evidence of abscess Aspiration Pneumonia Inability to protect airway.  Able to be extubated overnight 6/21 Plan Push pulmonary hygiene Careful swallow evaluation since he is had dysphasia leading up to the aspiration event.  Okay for sips and meds.  SLP evaluation Last dose of dexamethasone this evening 6/21 Chest x-ray is cleared from presumed aspiration pneumonitis.  Suspect we can stop his empiric antibiotics, plan to do so 6/21 and follow clinically  Right Upper Lobe Airspace Opacity, resolved Suspect Aspiration pneumonitis, improved No fever/ No leukocytosis Plan Stop  ceftriaxone on 6/21 Follow culture data, clinical status.   Gastri Occult + Received from OSH on Octreotide gtt Hx ETOH Plan Continue scheduled Protonix Follow CBC and for any evidence of active bleeding  Hx. ETOH Abuse (Smart Start ETOH Interlock on car) Hx. Benzos and Opiates Drug screen + for benzos and Marijuana Agitation Plan At risk for withdrawal from his chronic benzos, narcotics, alcohol Restart home clonazepam at half dose, Flexeril as needed half dose Restart home gabapentin, meloxicam Home Norco available as needed   Best practice:  Diet: Okay for sips and ice chips, SLP pending Pain/Anxiety/Delirium protocol (if indicated): Completed VAP protocol (if indicated): Pleated DVT prophylaxis: SCD's  GI prophylaxis: Protonix Glucose control: Stop CBG Mobility: Up to chair with assistance Code Status: Full Family Communication: attempted to call Marylu LundJanet, spouse 6/21. Left message, will retry Disposition: ICU, possibly out of the ICU 6/21 or 6/22 depending on mental status  Labs   CBC: Recent Labs  Lab 04/23/19 1349 04/23/19 1440 04/24/19 0405 04/24/19 0731 04/25/19 0544  WBC  --  7.9 9.5  --  12.5*  HGB 13.6 13.7 12.5* 12.2* 12.3*  HCT 40.0 43.0 38.5* 36.0* 38.5*  MCV  --  90.9 90.0  --  92.8  PLT  --  224 197  --  563    Basic Metabolic Panel: Recent Labs  Lab 04/23/19 1348 04/23/19 1349 04/24/19 0405 04/24/19 0731 04/25/19 0544  NA  --  140 140 141 141  K  --  3.9 3.9 3.8 4.4  CL  --   --  111  --  108  CO2  --   --  21*  --  27  GLUCOSE  --   --  138*  --  125*  BUN  --   --  18  --  29*  CREATININE 0.95  --  0.92  --  1.02  CALCIUM  --   --  8.2*  --  8.5*  MG  --   --  2.1  --  2.3  PHOS  --   --  3.1  --   --    GFR: Estimated Creatinine Clearance: 87.7 mL/min (by C-G formula based on SCr of 1.02 mg/dL). Recent Labs  Lab 04/23/19 1348 04/23/19 1440 04/24/19 0405 04/25/19 0544  PROCALCITON <0.10  --  <0.10  --   WBC  --  7.9 9.5  12.5*    Liver Function Tests: Recent Labs  Lab 04/24/19 0405  AST 17  ALT 18  ALKPHOS 43  BILITOT 1.5*  PROT 5.3*  ALBUMIN 3.1*   No results for input(s): LIPASE, AMYLASE in the last 168 hours. No results for input(s): AMMONIA in the last 168 hours.  ABG    Component Value Date/Time   PHART 7.436 04/24/2019 0731   PCO2ART 33.8 04/24/2019 0731   PO2ART 147.0 (H) 04/24/2019 0731   HCO3 22.8 04/24/2019 0731   TCO2 24 04/24/2019 0731   ACIDBASEDEF 1.0 04/24/2019 0731   O2SAT 99.0 04/24/2019 0731     Coagulation Profile: No results for input(s): INR, PROTIME in the last 168 hours.  Cardiac Enzymes: No results for input(s): CKTOTAL, CKMB, CKMBINDEX, TROPONINI in the last 168 hours.  HbA1C: No results found for: HGBA1C  CBG: Recent Labs  Lab 04/24/19 1613 04/24/19 1914 04/24/19 2355 04/25/19 0326 04/25/19 0826  GLUCAP 102* 113* 118* 131* 105*    Critical care time: 31 minutes     Baltazar Apo, MD, PhD 04/25/2019, 9:46 AM [facility] [phone] or if no answer [phone]

## 2019-04-25 NOTE — Progress Notes (Addendum)
OVERNIGHT COVERAGE CRITICAL CARE PROGRESS NOTE  CTSP re: cuff leak.  Review of the chart suggests that the patient was intubated for airway compromise suspected to be due to airway edema.  Aspirated honey and strawberries at home.  Has been on Decadron 4 mg q 12 hr since 6/19 @ 1500.  Now demonstrating suspected cuff leak.  Combative.  Wants to be extubated.  Able to force air around ETT cuff. Test deflation of the cuff reveals a patient that is able to talk around his ET tube.  PSV trial.  Recheck cuff leak.  Anticipate extubation tonight.  Critical care time: 30 minutes.  The treatment and management of the patient's condition was required based on the threat of imminent deterioration. This time reflects time spent by the physician evaluating, providing care and managing the critically ill patient's care. The time was spent at the immediate bedside (or on the same floor/unit and dedicated to this patient's care). Time involved in separately billable procedures is NOT included int he critical care time indicated above. Family meeting and update time may be included above if and only if the patient is unable/incompetent to participate in clinical interview and/or decision making, and the discussion was necessary to determining treatment decisions.  Renee Pain, MD Board Certified by the ABIM, [place] (04/25/2019 3354)  Patient is breathing comfortably on PSV 10/5.  Large air leak when cuff deflated.  Extubate.  Bedside swallow evaluation in am.  Renee Pain, MD Board Certified by the ABIM, [place]

## 2019-04-25 NOTE — Procedures (Signed)
Extubation Procedure Note  Patient Details:   Name: SHIHEEM CORPORAN DOB: [birth year] MRN: [id number]   Airway Documentation:    Vent end date: 04/25/19 Vent end time: 0200   Evaluation  O2 sats: stable throughout Complications: No apparent complications Patient did tolerate procedure well. Bilateral Breath Sounds: Clear, Diminished   Yes   Cuff leak positive Ext to 5 L [place] HR 122 RR 17 and Sat of 96%  Milinda Cave 04/25/2019, 2:09 AM

## 2019-04-25 NOTE — Progress Notes (Signed)
Pt refusing another IV at this time. All his meds are PO at this time. Dr. Lamonte Sakai informed of pt's refusal. Will continue to closely monitor pt.

## 2019-04-25 NOTE — Evaluation (Signed)
Clinical/Bedside Swallow Evaluation Patient Details  Name: Evan Wolf MRN: [id number] Date of Birth: 1960-11-19  Today's Date: 04/25/2019 Time: SLP Start Time (ACUTE ONLY): 1315 SLP Stop Time (ACUTE ONLY): 1345 SLP Time Calculation (min) (ACUTE ONLY): 30 min  Past Medical History:  Past Medical History:  Diagnosis Date  . Anxiety   . Arthritis   . Chronic back pain    Past Surgical History:  Past Surgical History:  Procedure Laterality Date  . CARPAL TUNNEL RELEASE Right   . FRACTURE SURGERY Right    ankle   HPI:  Evan Wolf, 58 y/m with progressive shortness of breath and difficulty swallowing. He aspirated on food and was taken to ER in [place] where he was intubated and transfered to [facility]. Pt was extubated 04/25/19 02:00 to 5L [place] and is currently tolerating room air.    Assessment / Plan / Recommendation Clinical Impression  Evan Wolf reports a progressive problem with his swallow. He recalled several incidents where he had to regurgitate solids back up to his mouth due to the inability to swallow them fully. The incident that led to this hospitalization was choking and airway occlusion of dehydrated strawberries with honey. Pt feels his swallowing has gotten worse over the last several weeks. He tolerated ice chips with no s/s of aspiration. This liquids by tsp, cups and straw were all followed by an immediate or delayed throat clear and a re-swallow. Purees required multiple swallows with throat clear and reswallow. A small piece of cracker moistened with water was difficult for pt to swallow. He refused to trial further solids. Recommend pt continue NPO with ice chips with staff, MBS to fully assess swallow function. Care plan to follow after instrumental assessment.  SLP Visit Diagnosis: Dysphagia, unspecified (R13.10)    Aspiration Risk  Moderate aspiration risk    Diet Recommendation NPO;Ice chips PRN after oral care   Medication Administration: Via alternative means     Other  Recommendations Oral Care Recommendations: Oral care BID   Follow up Recommendations   MBS       Swallow Study   General Date of Onset: 04/23/19 HPI: Evan Wolf, 58 y/m with progressive shortness of breath and difficulty swallowing. He aspirated on food and was taken to ER in [place] where he was intubated and transfered to [facility]. Pt was extubated 04/25/19 02:00 to 5L [place] and is currently tolerating room air.  Type of Study: Bedside Swallow Evaluation Previous Swallow Assessment: none Diet Prior to this Study: NPO Temperature Spikes Noted: No Respiratory Status: Room air History of Recent Intubation: Yes Length of Intubations (days): 2 days Date extubated: 04/23/19 Behavior/Cognition: Alert;Cooperative;Pleasant mood Oral Cavity Assessment: Within Functional Limits Oral Care Completed by SLP: Yes Oral Cavity - Dentition: Adequate natural dentition Vision: Functional for self-feeding Self-Feeding Abilities: Able to feed self Patient Positioning: Upright in bed Baseline Vocal Quality: Normal Volitional Cough: Strong Volitional Swallow: Able to elicit    Oral/Motor/Sensory Function Overall Oral Motor/Sensory Function: Within functional limits   Ice Chips Ice chips: Within functional limits Presentation: Spoon   Thin Liquid Thin Liquid: Impaired Presentation: Spoon;Cup;Straw Pharyngeal  Phase Impairments: Throat Clearing - Delayed;Multiple swallows    Nectar Thick Nectar Thick Liquid: Not tested   Honey Thick Honey Thick Liquid: Not tested   Puree Puree: Impaired Presentation: Spoon Pharyngeal Phase Impairments: Throat Clearing - Immediate;Multiple swallows   Solid     Solid: Impaired Presentation: Self Fed Pharyngeal Phase Impairments: Cough - Immediate;Multiple swallows(stated, much harder to swallow  and refused more)      Evan HoseSarah J. Kalesha Irving, MA, CCC-SLP 04/25/2019 1:48 PM

## 2019-04-26 ENCOUNTER — Inpatient Hospital Stay (HOSPITAL_COMMUNITY): Payer: Commercial Managed Care - PPO

## 2019-04-26 DIAGNOSIS — R1310 Dysphagia, unspecified: Secondary | ICD-10-CM

## 2019-04-26 DIAGNOSIS — J690 Pneumonitis due to inhalation of food and vomit: Secondary | ICD-10-CM

## 2019-04-26 MED ORDER — PANTOPRAZOLE SODIUM 40 MG PO TBEC: 40.0000 mg | DELAYED_RELEASE_TABLET | Freq: Every day | ORAL | 0 refills | Status: DC

## 2019-04-26 NOTE — Progress Notes (Addendum)
NAME:  Evan Wolf, MRN:  [id number], DOB:  June 07, 1961, LOS: 3 ADMISSION DATE:  04/23/2019, CONSULTATION DATE:  04/23/2019 REFERRING MD:  Floy Sabina, CHIEF COMPLAINT:  Dysphasia/ Airway swelling/ respiratory Failure   Brief History   History obtained from medical record as patient is intubated and sedated. 58 year old male never smoker with history of shortness of breath and difficulty swallowing, with  throat swelling  for the last month. He aspirated on honey and strawberries on 04/23/2019 while at home ,and was emergently transported to an outside hospital via EMS where he was emergently Intubated with a #6 ETT. He had associated vomiting with suspected aspiration  pneumonitis. BP was initially soft, but patient has responded to IVF ( 3 L per report). He also had positive gastric occult test, and was started on an octreotide gtt. He is heavily sedated on Fentanyl, versed, and propofol. He has been transferred to [facility] for management of care  by the [facility] service.  History of present illness    58 year old male never smoker with history of shortness of breath and difficulty swallowing, with  throat swelling  for the last month. He aspirated on honey and strawberries on 04/23/2019 while at home ,and was emergently transported to an outside hospital via EMS where he was emergently Intubated with a #6 ETT. He had associated vomiting with suspected aspiration  pneumonitis. Initial Lactate was 3.0, BP was initially soft, but patient has responded to IVF ( 3 L per report). Last lactate was 1.6. Troponin was < 0.01. He also had positive gastric occult test, and was started on an octreotide gtt. HGB is 15.4 and platelets are 275,000. Coags are WNL.WBC is 6.6. TSH and Free T 4 are WNL. He is heavily sedated on Fentanyl, versed, and propofol.  CTA showed bilateral medial lower lobe airspace opacities, right greater than left.  Posterior right upper lobe airspace opacity, suspicious for aspiration pneumonia.  There was  no evidence of PE.  CT neck with contrast was positive for diffuse symmetric edema surrounding the larynx and the thyroid with in the deep cervical compartments.  No mass or abscess noted no lymphadenopathy. Pt. Has a history of ETOH abuse , and  oxycodone and benzos use. Urine drug screen  was + for Benzos and Marijuana. Pt.  has been transferred to [facility] on 04/23/2019 for further management and care   by the PCCM service.   Past Medical History    Past Medical History:  Diagnosis Date  . Anxiety   . Arthritis   . Chronic back pain   ETOH abuse  Significant Hospital Events   04/23/2019 Admission to [facility] 6/19 ETT #6.0 6/19 Transferred to [facility]  Consults:  PCCM  Procedures:  6/19>> Intubation  Significant Diagnostic Tests:  6/19 CXR Mild bilateral hilar and mediastinal fullness cannot exclude adenopathy.  Right lung clear.  No effusions. 04/23/2019 CT neck with contrast Diffuse symmetric edema surrounding the larynx and thyroid gland with deep cervical compartments.  No discrete mass or abscess.  No lymphadenopathy. Findings of uncertain etiology may represent allergic reaction, thyroiditis, laryngitis, or submandibular sialadenitis given location of edema, or possibly sequelae of trauma, choking or severe vomiting..  04/23/2019 CTA chest with contrast Bilateral medial lower lobe airspace opacities, right greater than left.  Posterior right upper lobe airspace opacity.  Cannot exclude pneumonia, including aspiration pneumonia.  No evidence of pulmonary embolism.  Micro Data:  6/19 Blood ( [facility]) 6/19 Covid negative  Antimicrobials:  Rocephin 6/19>>  6/21 Piper/ Tazo 6/19 >> x 1 dose @ [facility]  Interim history/subjective:  AAOx4. States he is hungry.  Objective   Blood pressure 125/83, pulse (!) 52, temperature 98.4 F (36.9 C), temperature source Oral, resp. rate 16, height 6' (1.829 m), weight 84.7 kg, SpO2 97 %.        Intake/Output Summary (Last 24  hours) at 04/26/2019 0833 Last data filed at 04/26/2019 0100 Gross per 24 hour  Intake 480 ml  Output 1145 ml  Net -665 ml   Filed Weights   04/23/19 1212 04/24/19 0434 04/25/19 0336  Weight: 84.4 kg 86.8 kg 84.7 kg   Physical Exam: General: Well-appearing, no acute distress HENT: [place], AT, OP clear, MMM Eyes: EOMI, no scleral icterus Respiratory: Clear to auscultation bilaterally.  No crackles, wheezing or rales Cardiovascular: RRR, -M/R/G, no JVD GI: BS+, soft, nontender Extremities:-Edema,-tenderness Neuro: AAO x4, CNII-XII grossly intact Skin: Intact, no rashes or bruising Psych: Normal mood, normal affect GU: Foley in place  Resolved Hospital Problem list   Lactic acidosis Acute respiratory failure, VDRF  Assessment & Plan:   Acute Respiratory Failure 2/2 diffuse symmetric edema surrounding the larynx and thyroid gland with deep cervical compartments.  Etiology unclear.  No evidence of abscess Aspiration Pneumonia Plan Extubated 6/21 Swallow eval  Right Upper Lobe Airspace Opacity, resolved Suspect Aspiration pneumonitis, improved No fever/ No leukocytosis Plan Stop ceftriaxone on 6/21 Follow culture data, clinical status.   Gastri Occult + Received from OSH on Octreotide gtt Hx ETOH Plan Continue scheduled Protonix Follow CBC and for any evidence of active bleeding  Hx. ETOH Abuse (Smart Start ETOH Interlock on car) Hx. Benzos and Opiates Drug screen + for benzos and Marijuana Agitation Plan At risk for withdrawal from his chronic benzos, narcotics, alcohol On home clonazepam, Flexeril as needed half dose On home gabapentin, meloxicam Home Norco available as needed Restarted home Zoloft  Reviewed home medications with pharmacy.  Best practice:  Diet: Okay for sips and ice chips, SLP pending Pain/Anxiety/Delirium protocol (if indicated): -- VAP protocol (if indicated): Pleated DVT prophylaxis: SCD's  GI prophylaxis: Protonix Glucose control: Stop  CBG Mobility: Up to chair with assistance Code Status: Full Family Communication: Updated patient on plan 6/22 Disposition: Transfer to floor vs home  Labs   CBC: Recent Labs  Lab 04/23/19 1349 04/23/19 1440 04/24/19 0405 04/24/19 0731 04/25/19 0544  WBC  --  7.9 9.5  --  12.5*  HGB 13.6 13.7 12.5* 12.2* 12.3*  HCT 40.0 43.0 38.5* 36.0* 38.5*  MCV  --  90.9 90.0  --  92.8  PLT  --  224 197  --  181    Basic Metabolic Panel: Recent Labs  Lab 04/23/19 1348 04/23/19 1349 04/24/19 0405 04/24/19 0731 04/25/19 0544  NA  --  140 140 141 141  K  --  3.9 3.9 3.8 4.4  CL  --   --  111  --  108  CO2  --   --  21*  --  27  GLUCOSE  --   --  138*  --  125*  BUN  --   --  18  --  29*  CREATININE 0.95  --  0.92  --  1.02  CALCIUM  --   --  8.2*  --  8.5*  MG  --   --  2.1  --  2.3  PHOS  --   --  3.1  --   --    GFR: Estimated  Creatinine Clearance: 87.7 mL/min (by C-G formula based on SCr of 1.02 mg/dL). Recent Labs  Lab 04/23/19 1348 04/23/19 1440 04/24/19 0405 04/25/19 0544  PROCALCITON <0.10  --  <0.10  --   WBC  --  7.9 9.5 12.5*    Liver Function Tests: Recent Labs  Lab 04/24/19 0405  AST 17  ALT 18  ALKPHOS 43  BILITOT 1.5*  PROT 5.3*  ALBUMIN 3.1*   No results for input(s): LIPASE, AMYLASE in the last 168 hours. No results for input(s): AMMONIA in the last 168 hours.  ABG    Component Value Date/Time   PHART 7.436 04/24/2019 0731   PCO2ART 33.8 04/24/2019 0731   PO2ART 147.0 (H) 04/24/2019 0731   HCO3 22.8 04/24/2019 0731   TCO2 24 04/24/2019 0731   ACIDBASEDEF 1.0 04/24/2019 0731   O2SAT 99.0 04/24/2019 0731     Coagulation Profile: No results for input(s): INR, PROTIME in the last 168 hours.  Cardiac Enzymes: No results for input(s): CKTOTAL, CKMB, CKMBINDEX, TROPONINI in the last 168 hours.  HbA1C: No results found for: HGBA1C  CBG: Recent Labs  Lab 04/24/19 1914 04/24/19 2355 04/25/19 0326 04/25/19 0826 04/25/19 1705  GLUCAP  113* 118* 131* 105* 106*    Care time: 36 minutes Coordinated care with patient, bedside RN and pharmacy.   Mechele CollinJane Lashanti Chambless, M.D. [facility] Pulmonary/Critical Care Medicine Pager: [phone] After hours pager: [phone]

## 2019-04-26 NOTE — Progress Notes (Signed)
Modified Barium Swallow Progress Note  Patient Details  Name: Evan Wolf MRN: [id number] Date of Birth: [birth year]  Today's Date: 04/26/2019  Modified Barium Swallow completed.  Full report located under Chart Review in the Imaging Section.  Brief recommendations include the following:  Clinical Impression  Patient presents with a mild pharyngeal dysphagia with swallow initiation delays to level of vallecular sinus with regular solids and thin liquids, but no aspiration or penetration and full clearance of all boluses from pharynx. Patient did present with mild delay in transit of regular solids and puree solid boluses, which occured just after transit through UES. Patient has already been found to have diffuse symmetric edema surrounding the larynx and the thyroid, and SLP observed suspected edema surrounding upper esophagus (non confirmed by Radiologist).   Swallow Evaluation Recommendations   Recommended Consults: Consider GI evaluation(questionable GERD)   SLP Diet Recommendations: Dysphagia 3 (Mech soft) solids;Thin liquid   Liquid Administration via: Cup;Straw   Medication Administration: Whole meds with liquid   Supervision: Patient able to self feed   Compensations: Follow solids with liquid   Postural Changes: Seated upright at 90 degrees   Oral Care Recommendations: Patient independent with oral care        [name] 04/26/2019,12:54 PM   [name], MA, [place] [facility] Acute Rehab Pager: [phone]

## 2019-04-26 NOTE — Discharge Summary (Signed)
Physician Discharge Summary  Patient ID: Evan Wolf MRN: [id number] DOB/AGE: 07-May-1961 58 y.o.  Admit date: 04/23/2019 Discharge date: 04/26/2019    Discharge Diagnoses:   1. Acute Respiratory failure secondary to larynx edema from presumed choking -Resolved   2. Right upper lobe lung opacity /aspiration pnuemonitis  -Resolved   3. Mild pharyngeal dysphagia /choking episode   4. ETOH /Polysubstance abuse .   5. Chronic Back pain                                                                       DISCHARGE PLAN BY DIAGNOSIS    Acute Respiratory Failure 2/2 diffuse symmetric edema surrounding the larynx and thyroid gland with deep cervical compartments.  Suspected choking espisode  Aspiration Pneumonitis - Chest xray cleared .   Plan >recommend follow up with Primary Care provider and decide if further evaluation is indicated.   Right Upper Lobe Airspace Opacity, resolved Antibiotics stopped 6/21 .   Gastri Occult + Received from OSH on Octreotide gtt-stopped 6/21 .  Mild pharyngeal dysphagia /Choking episode -speech therapy evaluation with swallow initiation delays but no aspiration -recommended D 3 diet -mechanical soft with thin liquids . And GI OP evaluation   Plan Continue Protonix at discharge  Mechanical Soft diet with thin liquids .  Follow up with PCP OP in 1 week with labs , occult stool and referral to GI   Hx. ETOH Abuse (Smart Start ETOH Interlock on car) Hx. Benzos and Opiates Drug screen + for benzos and Marijuana Chronic back pain .   Plan Continue home meds .  Follow up Primary MD , consider OP counseling .   Discharge Plan:  Follow up with PCP in 1 week with labs .  Continue on Protonix at discharge .                       DISCHARGE SUMMARY   58 year old male never smoker developed severe shortness of breath and difficulty swallowing with throat swelling on 619 after he aspirated on strawberries/honey.  He was transported via EMS to  the emergency room.  He required emergent intubation.  He had associated vomiting and suspected aspiration pneumonitis.  Initial lactate was elevated at 3.0.  Blood pressure was initially soft he was given aggressive IV fluid resuscitation.  Lactate trended down to 1.6.  Troponin was less than 0.01.  Patient had a positive gastric occult test.  And was started on octreotide drip.  Hemoglobin was 15.4.  Platelets were 275,000.  Coags were within normal limits.  WBC count was 6.6.  TSH and free T4 within normal limits.  Patient was transported to [facility].  Required heavy sedation with fentanyl, Versed and propofol.  CT chest showed bilateral lower lobe airspace opacities right greater than left.  Negative for pulmonary emboli.  CT neck showed positive diffuse symmetric edema surrounding the larynx and the thyroid within the deep cervical compartments.  No abscess noted.  No lymphadenopathy.  Patient has a known history of alcohol abuse.  Has chronic back pain is on oxycodone.  Urine drug screen was positive for benzos and marijuana. Blood cultures have been negative to date.  Sputum culture showed normal respiratory flora.  Patient  had progressive improvement and was successfully extubated on June 20.  Speech evaluation showed mild pharyngeal dysphagia with swallow initiation delays to the level of the vallecular sinus was regular solid and thin liquids.  But no aspiration or penetration.  Full clearance of all boluses from pharynx.  He was recommended to use a mechanical soft D3 diet along with thin liquids.  Chest x-ray on June 21 showed clearance of infiltrates.  Antibiotics were discontinued.  Patient remained afebrile.  Mentation improved and returned back to baseline. Hemoglobin remained stable.  No active bleeding noted.  Patient denies any shortness of breath chest pain abdominal pain or nausea vomiting.  Says he is hungry.  And is ready to go home.                SIGNIFICANT DIAGNOSTIC  STUDIES 6/19 CXR Mild bilateral hilar and mediastinal fullness cannot exclude adenopathy.  Right lung clear.  No effusions. 04/23/2019 CT neck with contrast Diffuse symmetric edema surrounding the larynx and thyroid gland with deep cervical compartments.  No discrete mass or abscess.  No lymphadenopathy. Findings of uncertain etiology may represent allergic reaction, thyroiditis, laryngitis, or submandibular sialadenitis given location of edema, or possibly sequelae of trauma, choking or severe vomiting..  04/23/2019 CTA chest with contrast Bilateral medial lower lobe airspace opacities, right greater than left.  Posterior right upper lobe airspace opacity.  Cannot exclude pneumonia, including aspiration pneumonia.  No evidence of pulmonary embolism.  MICRO DATA  COVID 19 NEG 6/19   ANTIBIOTICS Rocephin 6/19>> 6/21 Piper/ Tazo 6/19 >> x 1 dose @ [place]  CONSULTS Speech therapy   TUBES / LINES 6/19 ETT > 6/20   Discharge Exam: General:well appearing , nad  Neuro:a/o x 3 , maew   CV: RRR no mrg  PULM:CTA no wheezing or accessory use  HW:TUUE , NT , BS + Extremities: warm , no edema   Vitals:   04/26/19 1100 04/26/19 1154 04/26/19 1200 04/26/19 1300  BP: 138/89  114/80 121/86  Pulse: 77  84 79  Resp: (!) 21  19 19   Temp:  98.4 F (36.9 C)    TempSrc:  Oral    SpO2: 95%  92% 93%  Weight:      Height:         Discharge Labs  BMET Recent Labs  Lab 04/23/19 1348  04/23/19 1349 04/24/19 0405 04/24/19 0731 04/25/19 0544  NA  --   --  140 140 141 141  K  --    < > 3.9 3.9 3.8 4.4  CL  --   --   --  111  --  108  CO2  --   --   --  21*  --  27  GLUCOSE  --   --   --  138*  --  125*  BUN  --   --   --  18  --  29*  CREATININE 0.95  --   --  0.92  --  1.02  CALCIUM  --   --   --  8.2*  --  8.5*  MG  --   --   --  2.1  --  2.3  PHOS  --   --   --  3.1  --   --    < > = values in this interval not displayed.    CBC Recent Labs  Lab 04/23/19 1440  04/24/19 0405 04/24/19 0731 04/25/19 0544  HGB 13.7 12.5* 12.2* 12.3*  HCT 43.0 38.5* 36.0* 38.5*  WBC 7.9 9.5  --  12.5*  PLT 224 197  --  181    Anti-Coagulation No results for input(s): INR in the last 168 hours.  Discharge Instructions    Call MD for:  difficulty breathing, headache or visual disturbances   Complete by: As directed    Call MD for:  extreme fatigue   Complete by: As directed    Call MD for:  hives   Complete by: As directed    Call MD for:  persistant dizziness or light-headedness   Complete by: As directed    Call MD for:  persistant nausea and vomiting   Complete by: As directed    Call MD for:  severe uncontrolled pain   Complete by: As directed    Call MD for:  temperature >100.4   Complete by: As directed    Discharge instructions   Complete by: As directed    Please follow up with Primary MD in 1 week with labs .   Increase activity slowly   Complete by: As directed        Follow-up Information    Cher Nakai, MD Follow up in 1 week(s).   Specialty: Internal Medicine Why: please call on 04/27/19 to make appointment for hospital folllow up with Dr. Truman Hayward ,will need follow up labs and consideration of referral to GI for swallow issues  Contact information: [place] [address] [phone]            Allergies as of 04/26/2019   No Known Allergies     Medication List    STOP taking these medications   collagenase ointment Commonly known as: SANTYL     TAKE these medications   clonazePAM 0.5 MG tablet Commonly known as: KLONOPIN Take 0.5 mg by mouth 4 (four) times daily as needed for anxiety.   cyclobenzaprine 10 MG tablet Commonly known as: FLEXERIL Take 10 mg by mouth 3 (three) times daily as needed for muscle spasms.   feeding supplement Liqd Take 1 Container by mouth 3 (three) times daily between meals.   gabapentin 300 MG capsule Commonly known as: NEURONTIN Take 300 mg by mouth 2 (two)  times daily.   HYDROcodone-acetaminophen 5-325 MG tablet Commonly known as: NORCO/VICODIN Take 1 tablet by mouth every 6 (six) hours as needed for moderate pain.   meloxicam 15 MG tablet Commonly known as: MOBIC Take 15 mg by mouth daily.   pantoprazole 40 MG tablet Commonly known as: Protonix Take 1 tablet (40 mg total) by mouth daily.   senna 8.6 MG Tabs tablet Commonly known as: SENOKOT Take 2 tablets (17.2 mg total) by mouth daily.   sertraline 100 MG tablet Commonly known as: ZOLOFT Take 100 mg by mouth daily.   tamsulosin 0.4 MG Caps capsule Commonly known as: FLOMAX Take 0.4 mg by mouth daily.         Disposition:  Discharge to home   Discharged Condition: MYLEZ VENABLE has met maximum benefit of inpatient care and is medically stable and cleared for discharge.  Patient is pending follow up as above.      Time spent on disposition: 40  Minutes.   Tammy Parrett NP-C  [facility]  [phone]  04/26/2019

## 2019-04-28 LAB — CULTURE, BLOOD (ROUTINE X 2)
Culture: NO GROWTH
Culture: NO GROWTH
Special Requests: ADEQUATE
Special Requests: ADEQUATE

## 2019-05-13 ENCOUNTER — Encounter: Payer: Self-pay | Admitting: Gastroenterology

## 2019-05-14 ENCOUNTER — Telehealth (INDEPENDENT_AMBULATORY_CARE_PROVIDER_SITE_OTHER): Payer: Commercial Managed Care - PPO | Admitting: Gastroenterology

## 2019-05-14 ENCOUNTER — Encounter: Payer: Self-pay | Admitting: Gastroenterology

## 2019-05-14 ENCOUNTER — Other Ambulatory Visit: Payer: Self-pay

## 2019-05-14 VITALS — Ht 71.0 in | Wt 175.0 lb

## 2019-05-14 DIAGNOSIS — R1310 Dysphagia, unspecified: Secondary | ICD-10-CM | POA: Diagnosis not present

## 2019-05-14 DIAGNOSIS — Z720 Tobacco use: Secondary | ICD-10-CM

## 2019-05-14 DIAGNOSIS — F1910 Other psychoactive substance abuse, uncomplicated: Secondary | ICD-10-CM

## 2019-05-14 DIAGNOSIS — R1319 Other dysphagia: Secondary | ICD-10-CM

## 2019-05-14 DIAGNOSIS — R634 Abnormal weight loss: Secondary | ICD-10-CM

## 2019-05-14 DIAGNOSIS — F1010 Alcohol abuse, uncomplicated: Secondary | ICD-10-CM

## 2019-05-14 MED ORDER — ONDANSETRON 4 MG PO TBDP: 4.0000 mg | ORAL_TABLET | Freq: Four times a day (QID) | ORAL | 1 refills | Status: AC | PRN

## 2019-05-14 MED ORDER — PANTOPRAZOLE SODIUM 40 MG PO TBEC: 40.0000 mg | DELAYED_RELEASE_TABLET | Freq: Two times a day (BID) | ORAL | 6 refills | Status: AC

## 2019-05-14 NOTE — Patient Instructions (Signed)
If you are age 57 or older, your body mass index should be between 23-30. Your Body mass index is 24.41 kg/m. If this is out of the aforementioned range listed, please consider follow up with your Primary Care Provider.  If you are age 91 or younger, your body mass index should be between 19-25. Your Body mass index is 24.41 kg/m. If this is out of the aformentioned range listed, please consider follow up with your Primary Care Provider.   To help prevent the possible spread of infection to our patients, communities, and staff; we will be implementing the following measures:  As of now we are not allowing any visitors/family members to accompany you to any upcoming appointments with [facility]. If you have any concerns about this please contact our office to discuss prior to the appointment.   We have sent the following medications to your pharmacy for you to pick up at your convenience: Protonix 40mg  twice daily Zofran 4mg  every 6 hours as needed  Please purchase the following medications over the counter and take as directed: Boost twice daily  You have been scheduled for a Barium Esophogram at [facility] Radiology (1st floor of the hospital) on 05/18/2019 at 11:00am. Please arrive 30 minutes prior to your appointment for registration. Make certain not to have anything to eat or drink 3 hours prior to your test. If you need to reschedule for any reason, please contact radiology at [phone] to do so. __________________________________________________________________ A barium swallow is an examination that concentrates on views of the esophagus. This tends to be a double contrast exam (barium and two liquids which, when combined, create a gas to distend the wall of the oesophagus) or single contrast (non-ionic iodine based). The study is usually tailored to your symptoms so a good history is essential. Attention is paid during the study to the form, structure and configuration of  the esophagus, looking for functional disorders (such as aspiration, dysphagia, achalasia, motility and reflux) EXAMINATION You may be asked to change into a gown, depending on the type of swallow being performed. A radiologist and radiographer will perform the procedure. The radiologist will advise you of the type of contrast selected for your procedure and direct you during the exam. You will be asked to stand, sit or lie in several different positions and to hold a small amount of fluid in your mouth before being asked to swallow while the imaging is performed .In some instances you may be asked to swallow barium coated marshmallows to assess the motility of a solid food bolus. The exam can be recorded as a digital or video fluoroscopy procedure. POST PROCEDURE It will take 1-2 days for the barium to pass through your system. To facilitate this, it is important, unless otherwise directed, to increase your fluids for the next 24-48hrs and to resume your normal diet.  This test typically takes about 30 minutes to perform. __________________________________________________________________________________   Thank you,  Dr. Jackquline Denmark

## 2019-05-14 NOTE — Progress Notes (Signed)
Chief Complaint: Dysphagia  Referring Provider:  [name], [name], MD      ASSESSMENT AND PLAN;   #1.  Progressive esophageal dysphagia with weight loss.  H/O aspiration pneumonia req [facility]- 04/26/2019. Neg speech eval. D/d includes esophageal stricture, Schatzki's ring, motility disorder, eosinophilic esophagitis, pill induced esophagitis, high risk for esophageal carcinoma, r/o extrinsic lesions.   #2. ETOH/polysubstance abuse/tobacco abuse.  Plan: -Increase Protonix 40 mg p.o. twice daily (#60, 6 refills) -Add nutritional supplement boost 1 twice daily. -Zofran 4 mg ODT every 6 hours as needed (#30, 1 refills). -Barium swallow with barium tablet ASAP. -Thereafter,  [facility].  I have discussed risks and benefits extensively occluding risks of aspiration, bleeding, perforation which can require thoracotomy.  Benefits were also discussed. -Cut down on marijuana and gradually stop. -Also would like him to stop dipping snuff. -Stop all alcohol use. -FU in 4 weeks. -Discussed with Heather.  HPI:    Evan Wolf is a 58 y.o. male  With 3235-month history of worsening dysphagia, significantly worse over last 1 month. Aspirated on dry strawberry-ad[facility] and then transferred to [facility] with respiratory failure due to aspiration requiring intubation.  Follow-up in the GI clinic Continues to have dysphagia-mostly to solids but also to liquids at times. Currently "living of chicken noodle soup" Continues to dip snuff Has lost 20 pounds in the last 3 weeks. Has been having occasional nausea/vomiting or regurgitation.  Zofran did help in the past.  Currently breathing very well.  Not on oxygen at home.  Denies having any diarrhea or constipation.  Has history of alcohol abuse in the past.  He currently has stopped.  Continues to dip snuff, only rare smoking.  Does use marijuana every day now.  No IV drug use.  Review of previous  work-up: 6/19 CXR Mild bilateral hilar and mediastinal fullness cannot exclude adenopathy. Right lung clear. No effusions. 04/23/2019 CT neck with contrast Diffuse symmetric edema surrounding the larynx and thyroid gland with deep cervical compartments. No discrete mass or abscess. No lymphadenopathy. Findings of uncertain etiology may represent allergic reaction, thyroiditis, laryngitis, or submandibular sialadenitis given location of edema, or possibly sequelae of trauma, choking or severe vomiting..  04/23/2019 CTA chest with contrast Bilateral medial lower lobe airspace opacities, right greater than left. Posterior right upper lobe airspace opacity. No PE  04/25/2019 MBS - as below  [employer]Works in [employer]  Past GI procedures: -Colonoscopy 08/2016-poor preparation, colonic polyp status post polypectomy. Due for rpt (when UGI symptoms are better and he can tolerate prep) Past Medical History:  Diagnosis Date   Anxiety    Arthritis    BPH (benign prostatic hyperplasia)    Bulging lumbar disc    Chronic back pain    ED (erectile dysfunction)    Insomnia    Overactive bladder     Past Surgical History:  Procedure Laterality Date   CARPAL TUNNEL RELEASE Right    COLONOSCOPY  08/30/2016   Colon poly, diverticulosis of colon. Internal hemorrhoids.    FRACTURE SURGERY Right    ankle    Family History  Problem Relation Age of Onset   Diabetes Mellitus II Mother    Deep vein thrombosis Mother    Alzheimer's disease Father    Parkinson's disease Father     Social History   Tobacco Use   Smoking status: Never Smoker   Smokeless tobacco: Never Used  Substance Use Topics  Alcohol use: No    Alcohol/week: 0.0 standard drinks   Drug use: Uses marijuana, dips snuff, uses pescribed benzos/opoiids    Current Outpatient Medications  Medication Sig Dispense Refill   clonazePAM (KLONOPIN) 0.5 MG tablet Take 0.5 mg by mouth 4 (four) times daily as  needed for anxiety.     cyclobenzaprine (FLEXERIL) 10 MG tablet Take 10 mg by mouth 3 (three) times daily as needed for muscle spasms.     senna (SENOKOT) 8.6 MG TABS tablet Take 2 tablets (17.2 mg total) by mouth daily. 120 each 0   sertraline (ZOLOFT) 100 MG tablet Take 100 mg by mouth daily.     tamsulosin (FLOMAX) 0.4 MG CAPS capsule Take 0.4 mg by mouth daily.     gabapentin (NEURONTIN) 300 MG capsule Take 300 mg by mouth 2 (two) times daily.     HYDROcodone-acetaminophen (NORCO/VICODIN) 5-325 MG tablet Take 1 tablet by mouth every 6 (six) hours as needed for moderate pain.     meloxicam (MOBIC) 15 MG tablet Take 15 mg by mouth daily.     pantoprazole (PROTONIX) 40 MG tablet Take 1 tablet (40 mg total) by mouth daily. (Patient not taking: Reported on 05/14/2019) 30 tablet 0   No current facility-administered medications for this visit.     No Known Allergies  Review of Systems:  Constitutional: Denies fever, chills, diaphoresis, appetite change and has fatigue.  HEENT: Denies photophobia, eye pain, redness, hearing loss, ear pain, congestion, sore throat, rhinorrhea, sneezing, mouth sores, neck pain, neck stiffness and tinnitus.   Respiratory: Denies SOB, DOE, cough, chest tightness,  and wheezing.  Had recently Cardiovascular: Denies chest pain, palpitations and leg swelling.  Genitourinary: Denies dysuria, urgency, frequency, hematuria, flank pain and difficulty urinating.  Musculoskeletal: Denies myalgias, back pain, joint swelling, arthralgias and gait problem.  Skin: No rash.  Neurological: Denies dizziness, seizures, syncope, weakness, light-headedness, numbness and headaches.  Hematological: Denies adenopathy. Easy bruising, personal or family bleeding history  Psychiatric/Behavioral: Has anxiety or depression     Physical Exam:    Ht 5\' 11"  (1.803 m)    Wt 175 lb (79.4 kg)    BMI 24.41 kg/m  Filed Weights   05/14/19 0953  Weight: 175 lb (79.4 kg)    Constitutional:  Well-developed, in no acute distress. Psychiatric: Normal mood and affect. Behavior is normal. televisit  Data Reviewed: I have personally reviewed following labs and imaging studies  CBC: CBC Latest Ref Rng & Units 04/25/2019 04/24/2019 04/24/2019  WBC 4.0 - 10.5 K/uL 12.5(H) - 9.5  Hemoglobin 13.0 - 17.0 g/dL 12.3(L) 12.2(L) 12.5(L)  Hematocrit 39.0 - 52.0 % 38.5(L) 36.0(L) 38.5(L)  Platelets 150 - 400 K/uL 181 - 197    CMP: CMP Latest Ref Rng & Units 04/25/2019 04/24/2019 04/24/2019  Glucose 70 - 99 mg/dL 161(W125(H) - 960(A138(H)  BUN 6 - 20 mg/dL 54(U29(H) - 18  Creatinine 0.61 - 1.24 mg/dL 9.811.02 - 1.910.92  Sodium 478135 - 145 mmol/L 141 141 140  Potassium 3.5 - 5.1 mmol/L 4.4 3.8 3.9  Chloride 98 - 111 mmol/L 108 - 111  CO2 22 - 32 mmol/L 27 - 21(L)  Calcium 8.9 - 10.3 mg/dL 2.9(F8.5(L) - 8.2(L)  Total Protein 6.5 - 8.1 g/dL - - 5.3(L)  Total Bilirubin 0.3 - 1.2 mg/dL - - 1.5(H)  Alkaline Phos 38 - 126 U/L - - 43  AST 15 - 41 U/L - - 17  ALT 0 - 44 U/L - - 18  Radiology Studies: Dg Chest Port 1 View  Result Date: 04/25/2019 CLINICAL DATA:  Acute respiratory failure. EXAM: PORTABLE CHEST 1 VIEW COMPARISON:  04/24/2019 FINDINGS: Lungs are adequately inflated without focal consolidation, effusion or pneumothorax. Cardiomediastinal silhouette and remainder of the exam is unchanged. IMPRESSION: No active disease. Electronically Signed   By: Marin Olp M.D.   On: 04/25/2019 08:19   Dg Chest Port 1 View  Result Date: 04/24/2019 CLINICAL DATA:  Respiratory failure, hypoxia EXAM: PORTABLE CHEST 1 VIEW COMPARISON:  Chest radiograph from one day prior. FINDINGS: Endotracheal tube tip is 5.6 cm above the carina. Enteric tube terminates in the proximal stomach. Stable cardiomediastinal silhouette with top-normal heart size. No pneumothorax. No pleural effusion. Stable mild left lung base atelectasis. No pulmonary edema. IMPRESSION: 1. Well-positioned support structures. 2. Stable mild left  lung base atelectasis. Electronically Signed   By: Ilona Sorrel M.D.   On: 04/24/2019 06:47   Dg Chest Port 1 View  Result Date: 04/23/2019 CLINICAL DATA:  Followup pneumonia. EXAM: PORTABLE CHEST 1 VIEW COMPARISON:  Chest x-ray 04/23/2019 and chest CT, same date. FINDINGS: The endotracheal tube is 8 cm above the carina and just below the thoracic inlet. The NG tube is coursing down the esophagus and into the stomach. The cardiac silhouette, mediastinal and hilar contours are within normal limits and stable given the AP projection and portable technique. Persistent streaky left basilar density suggesting an infiltrate. No pleural effusions or pulmonary edema. IMPRESSION: 1. Support apparatus without complicating features as detailed above. 2. Persistent streaky left basilar density, likely infiltrate. 3. No edema or effusions. Electronically Signed   By: Marijo Sanes M.D.   On: 04/23/2019 13:25   Dg Swallowing Func-speech Pathology  Result Date: 04/26/2019 Objective Swallowing Evaluation: Type of Study: MBS-Modified Barium Swallow Study  Patient Details Name: CARLOSDANIEL GROB MRN: [id number] Date of Birth: [birth year] Today's Date: 04/26/2019 Time: SLP Start Time (ACUTE ONLY): 0900 -SLP Stop Time (ACUTE ONLY): 0920 SLP Time Calculation (min) (ACUTE ONLY): 20 min Past Medical History: Past Medical History: Diagnosis Date  Anxiety   Arthritis   Chronic back pain  Past Surgical History: Past Surgical History: Procedure Laterality Date  CARPAL TUNNEL RELEASE Right   FRACTURE SURGERY Right   ankle HPI: Mr Sanjith Siwek, 58 y/m with progressive shortness of breath and difficulty swallowing. He aspirated on food and was taken to ER in [place] where he was intubated and transfered to [facility]. Pt was extubated 04/25/19 02:00 to 5L [place] and is currently tolerating room air.  Subjective: alert, cooperative Assessment / Plan / Recommendation CHL IP CLINICAL IMPRESSIONS 04/26/2019 Clinical Impression Patient presents with a mild  pharyngeal dysphagia with swallow initiation delays to level of vallecular sinus with regular solids and thin liquids, but no aspiration or penetration and full clearance of all boluses from pharynx. Patient did present with mild delay in transit of regular solids and puree solid boluses, which occured just after transit through UES. Patient has already been found to have diffuse symmetric edema surrounding the larynx and the thyroid, and SLP observed suspected edema surrounding upper esophagus (non confirmed by Radiologist). SLP Visit Diagnosis Dysphagia, pharyngeal phase (R13.13) Attention and concentration deficit following -- Frontal lobe and executive function deficit following -- Impact on safety and function Mild aspiration risk;No limitations   CHL IP TREATMENT RECOMMENDATION 04/26/2019 Treatment Recommendations Therapy as outlined in treatment plan below   Prognosis 04/26/2019 Prognosis for Safe Diet Advancement Good Barriers to Reach Goals -- Barriers/Prognosis Comment -- CHL  IP DIET RECOMMENDATION 04/26/2019 SLP Diet Recommendations Dysphagia 3 (Mech soft) solids;Thin liquid Liquid Administration via Cup;Straw Medication Administration Whole meds with liquid Compensations Follow solids with liquid Postural Changes Seated upright at 90 degrees   CHL IP OTHER RECOMMENDATIONS 04/26/2019 Recommended Consults Consider GI evaluation Oral Care Recommendations Patient independent with oral care Other Recommendations --   CHL IP FOLLOW UP RECOMMENDATIONS 04/26/2019 Follow up Recommendations None   CHL IP FREQUENCY AND DURATION 04/26/2019 Speech Therapy Frequency (ACUTE ONLY) min 1 x/week Treatment Duration 1 week      CHL IP ORAL PHASE 04/26/2019 Oral Phase WFL Oral - Pudding Teaspoon -- Oral - Pudding Cup -- Oral - Honey Teaspoon -- Oral - Honey Cup -- Oral - Nectar Teaspoon -- Oral - Nectar Cup -- Oral - Nectar Straw -- Oral - Thin Teaspoon -- Oral - Thin Cup -- Oral - Thin Straw -- Oral - Puree -- Oral - Mech Soft --  Oral - Regular -- Oral - Multi-Consistency -- Oral - Pill -- Oral Phase - Comment --  CHL IP PHARYNGEAL PHASE 04/26/2019 Pharyngeal Phase Impaired Pharyngeal- Pudding Teaspoon -- Pharyngeal -- Pharyngeal- Pudding Cup -- Pharyngeal -- Pharyngeal- Honey Teaspoon -- Pharyngeal -- Pharyngeal- Honey Cup -- Pharyngeal -- Pharyngeal- Nectar Teaspoon -- Pharyngeal -- Pharyngeal- Nectar Cup -- Pharyngeal -- Pharyngeal- Nectar Straw -- Pharyngeal -- Pharyngeal- Thin Teaspoon -- Pharyngeal -- Pharyngeal- Thin Cup Delayed swallow initiation-vallecula Pharyngeal -- Pharyngeal- Thin Straw Delayed swallow initiation-vallecula Pharyngeal -- Pharyngeal- Puree WFL Pharyngeal -- Pharyngeal- Mechanical Soft -- Pharyngeal -- Pharyngeal- Regular Delayed swallow initiation-vallecula Pharyngeal -- Pharyngeal- Multi-consistency -- Pharyngeal -- Pharyngeal- Pill Reduced pharyngeal peristalsis Pharyngeal -- Pharyngeal Comment --  CHL IP CERVICAL ESOPHAGEAL PHASE 04/26/2019 Cervical Esophageal Phase WFL Pudding Teaspoon -- Pudding Cup -- Honey Teaspoon -- Honey Cup -- Nectar Teaspoon -- Nectar Cup -- Nectar Straw -- Thin Teaspoon -- Thin Cup -- Thin Straw -- Puree -- Mechanical Soft -- Regular -- Multi-consistency -- Pill -- Cervical Esophageal Comment -- Pablo Lawrencereston, John Tarrell 04/26/2019, 12:52 PM Angela NevinJohn T. Preston, MA, CCC-SLP Speech Therapy [facility] Pager: [phone]              This service was provided via [employer] video (worked only for 1st 5 min, then telephone) telemedicine.  The patient was located at work.  The provider was located in work.  The patient did consent to this telephone visit and is aware of possible charges through their insurance for this visit.  The patient was referred by Dr. Nedra HaiLee.    Time spent on call/review of records/coordination of care: 45 min    Edman Circleaj Deshanti Adcox, MD 05/14/2019, 10:50 AM  Cc: [name], [name], MD

## 2019-05-18 ENCOUNTER — Ambulatory Visit (HOSPITAL_COMMUNITY): Admission: RE | Admit: 2019-05-18 | Payer: Commercial Managed Care - PPO | Source: Ambulatory Visit

## 2019-06-14 ENCOUNTER — Ambulatory Visit: Payer: Commercial Managed Care - PPO | Admitting: Gastroenterology

## 2019-08-31 ENCOUNTER — Telehealth: Payer: Self-pay | Admitting: Gastroenterology

## 2019-08-31 NOTE — Telephone Encounter (Signed)
Pt is calling to R/S his barium swallow examination from back in July. He states he has 2 weeks off around Christmas that he can fit this appointments in as he works out of town. He is asking if he needs to come see Dr. Lyndel Safe before the procedure or if he would need to after so he can work the appointment in those weeks.

## 2019-08-31 NOTE — Telephone Encounter (Signed)
No problems Please coordinate with him and his schedule RG

## 2019-08-31 NOTE — Telephone Encounter (Signed)
Please review previous message and advise 

## 2019-09-01 NOTE — Telephone Encounter (Signed)
Called and spoke with patient-patient informed of phone number to call to schedule the barium swallow test= [phone])=at his convenience as he will have some time off and can complete the test during his vacation time;  Patient verbalized understanding of information/instructions;  Patient was advised to call the office at [phone] if questions/concerns arise;

## 2019-10-25 ENCOUNTER — Encounter: Payer: Self-pay | Admitting: Gastroenterology

## 2019-11-02 ENCOUNTER — Other Ambulatory Visit: Payer: Self-pay

## 2019-11-02 ENCOUNTER — Ambulatory Visit (HOSPITAL_COMMUNITY)
Admission: RE | Admit: 2019-11-02 | Discharge: 2019-11-02 | Disposition: A | Discharge status: Home / Self Care | Payer: Commercial Managed Care - PPO | Source: Ambulatory Visit | Attending: Gastroenterology | Admitting: Gastroenterology

## 2019-11-02 DIAGNOSIS — Z720 Tobacco use: Secondary | ICD-10-CM | POA: Insufficient documentation

## 2019-11-02 DIAGNOSIS — R1319 Other dysphagia: Secondary | ICD-10-CM

## 2019-11-02 DIAGNOSIS — F1910 Other psychoactive substance abuse, uncomplicated: Secondary | ICD-10-CM | POA: Insufficient documentation

## 2019-11-02 DIAGNOSIS — R634 Abnormal weight loss: Secondary | ICD-10-CM | POA: Insufficient documentation

## 2019-11-02 DIAGNOSIS — F1010 Alcohol abuse, uncomplicated: Secondary | ICD-10-CM | POA: Insufficient documentation

## 2019-11-02 DIAGNOSIS — R1310 Dysphagia, unspecified: Secondary | ICD-10-CM | POA: Insufficient documentation

## 2020-10-23 IMAGING — RF DG ESOPHAGUS
7 of 9 series · 13 of 18 positions shown · non-contrast
Comparison: The speech and swallow report 04/26/2019

CLINICAL DATA: Dysphasia.

EXAM:
ESOPHOGRAM / BARIUM SWALLOW / BARIUM TABLET STUDY
TECHNIQUE: Combined double contrast and single contrast examination performed
using effervescent crystals, thick barium liquid, and thin barium
liquid. The patient was observed with fluoroscopy swallowing a 13 mm
barium sulphate tablet.
FLUOROSCOPY TIME:  Fluoroscopy Time:  1 minutes 30 seconds
Radiation Exposure Index (if provided by the fluoroscopic device):
20.4 mGy
Number of Acquired Spot Images: 0

[Series 1: cp_standard · 0.26mm/px · 1 of 1 slices shown (1 of 7)]
[im 1/1]
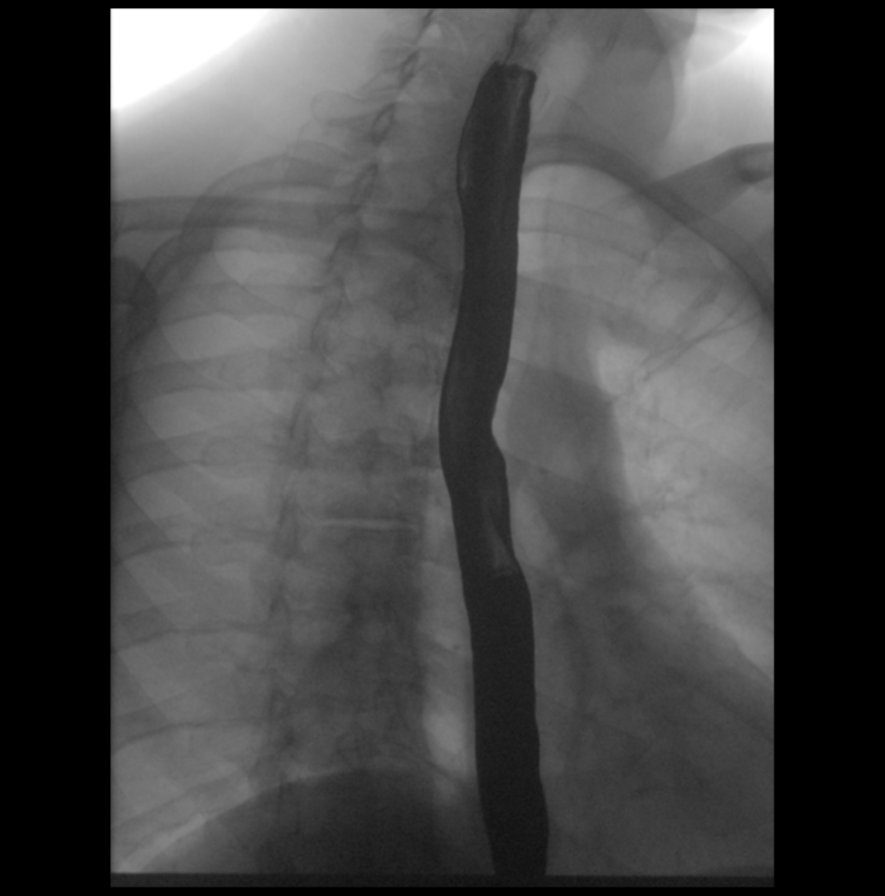

[Series 3: cp_standard · 0.26mm/px · 1 of 1 slices shown (2 of 7)]
[im 1/1]
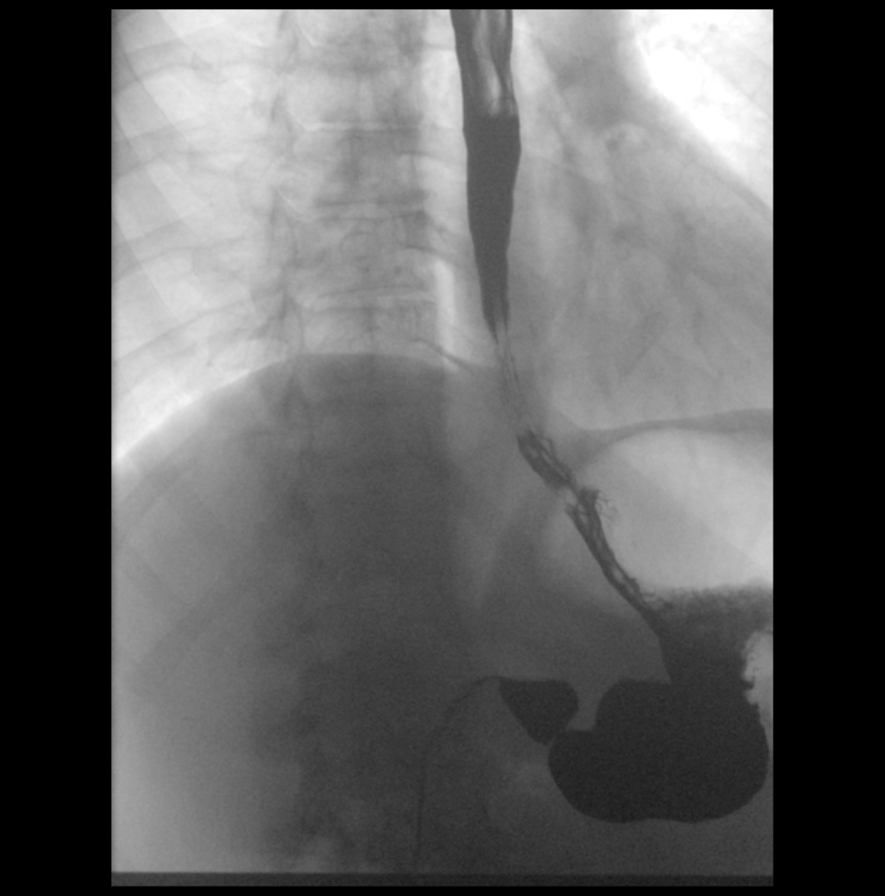

[Series 4: cp_standard · 0.26mm/px · 1 of 1 slices shown (3 of 7)]
[im 1/1]
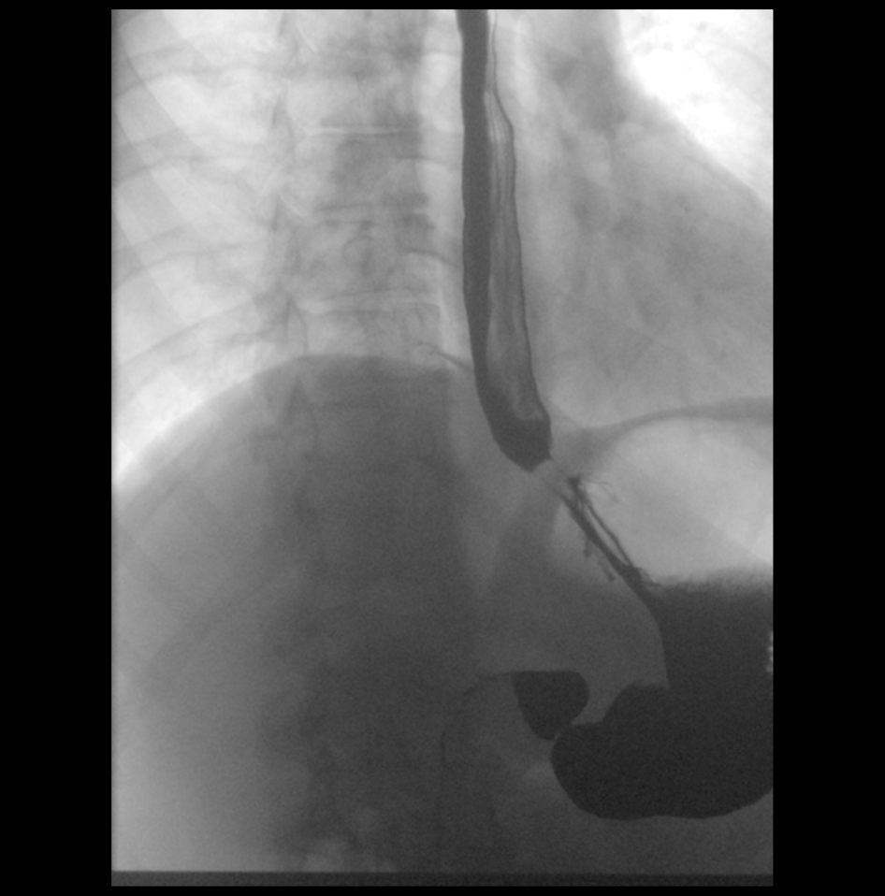

[Series 5: cp_standard · 0.52mm/px · 3 of 146 frames shown (4 of 7)]
[frame 22/146]
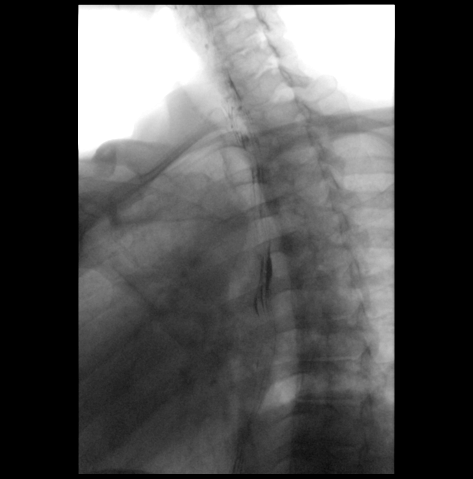
[frame 125/146]
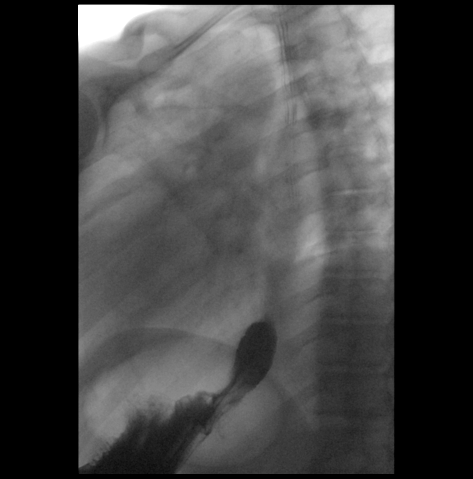
[frame 146/146]
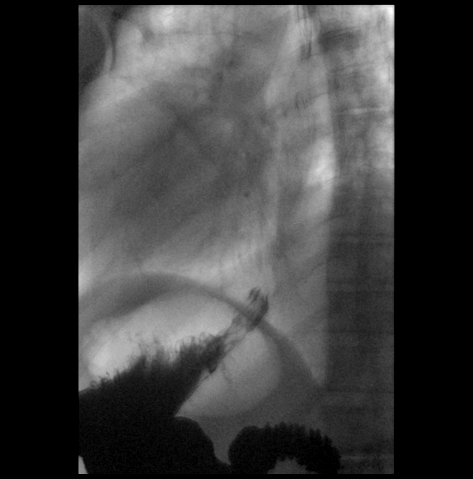

[Series 7: cp_standard · 0.17mm/px · 1 of 1 slices shown (5 of 7)]
[im 1/1]
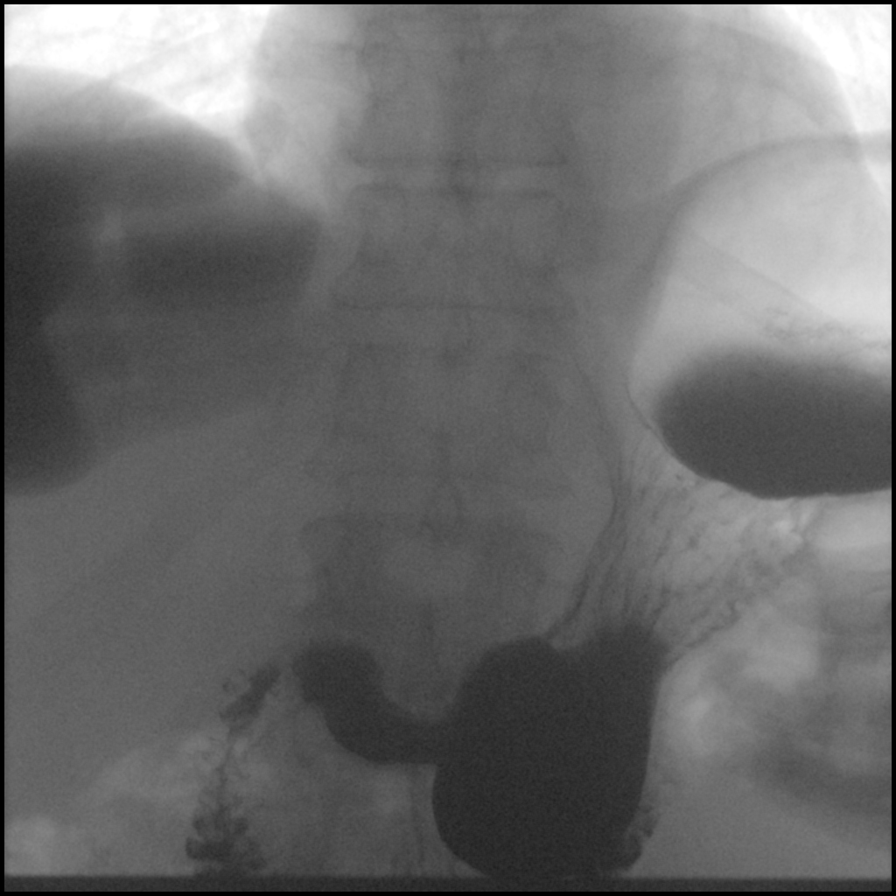

[Series 8: cp_standard · 0.34mm/px · 3 of 40 frames shown (6 of 7)]
[frame 7/40]
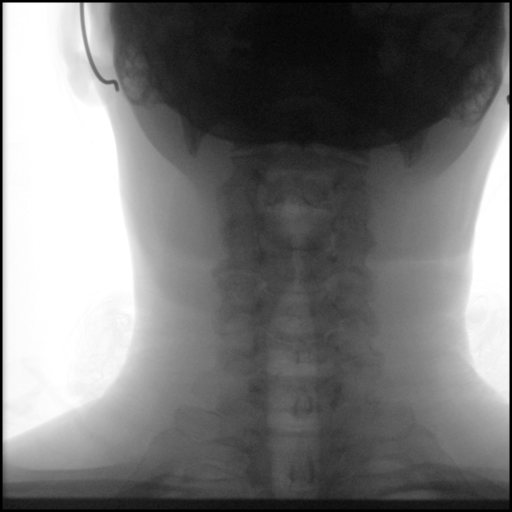
[frame 21/40]
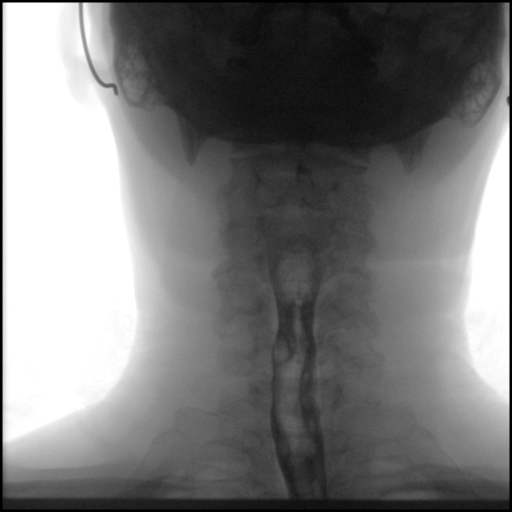
[frame 40/40]
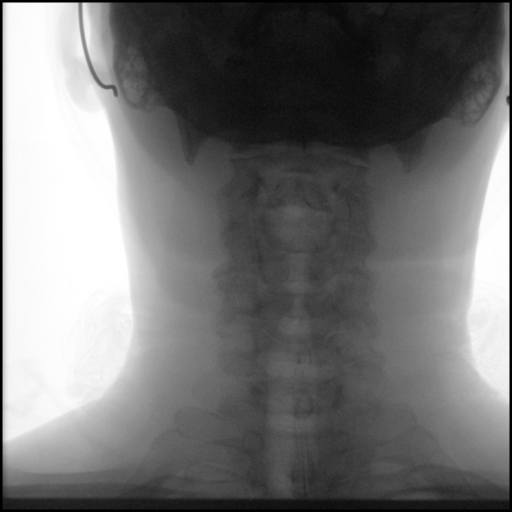

[Series 9: cp_standard · 0.34mm/px · 3 of 96 frames shown (7 of 7)]
[frame 15/96]
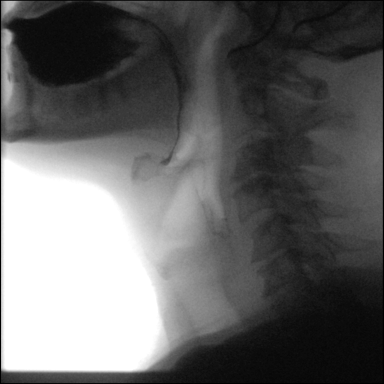
[frame 49/96]
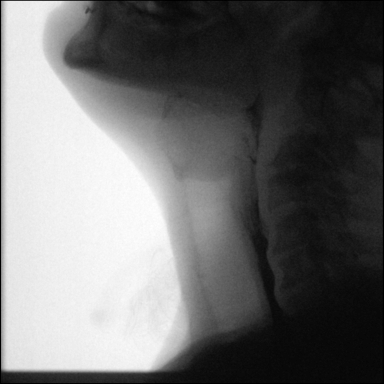
[frame 82/96]
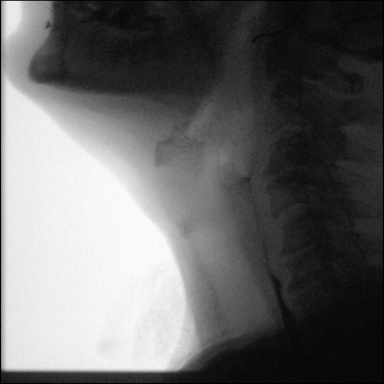

[13 of 18 positions shown; findings below may reference images not displayed]

FINDINGS: The esophagus is patent. No stricture or mass identified. A 13 mm
barium tablet was ingested which easily passed through the esophagus
and into the stomach. The motility of the esophagus is unremarkable.
No hiatal hernia or reflux identified.

The swallowing mechanism was evaluated. No aspiration noted with
thin barium.
IMPRESSION: 1. Normal appearance of the esophagus.
2. No stricture or mass identified.
3. No aspiration.
